# Patient Record
Sex: Female | Born: 1950 | Race: White | Hispanic: No | Marital: Married | Smoking: Never smoker
Health system: Southern US, Community
[De-identification: ages and names within clinical notes are randomized; demographics above are authoritative.]

## PROBLEM LIST (undated history)

## (undated) DIAGNOSIS — N189 Chronic kidney disease, unspecified: Secondary | ICD-10-CM

## (undated) DIAGNOSIS — M1990 Unspecified osteoarthritis, unspecified site: Secondary | ICD-10-CM

## (undated) DIAGNOSIS — Z9221 Personal history of antineoplastic chemotherapy: Secondary | ICD-10-CM

## (undated) DIAGNOSIS — K219 Gastro-esophageal reflux disease without esophagitis: Secondary | ICD-10-CM

## (undated) DIAGNOSIS — D696 Thrombocytopenia, unspecified: Secondary | ICD-10-CM

## (undated) DIAGNOSIS — C9201 Acute myeloblastic leukemia, in remission: Secondary | ICD-10-CM

## (undated) DIAGNOSIS — I10 Essential (primary) hypertension: Secondary | ICD-10-CM

## (undated) DIAGNOSIS — M8580 Other specified disorders of bone density and structure, unspecified site: Secondary | ICD-10-CM

## (undated) DIAGNOSIS — R1310 Dysphagia, unspecified: Secondary | ICD-10-CM

## (undated) HISTORY — DX: Other specified disorders of bone density and structure, unspecified site: M85.80

## (undated) HISTORY — DX: Essential (primary) hypertension: I10

## (undated) HISTORY — PX: AUGMENTATION MAMMAPLASTY: SUR837

---

## 2004-05-10 HISTORY — PX: PLACEMENT OF BREAST IMPLANTS: SHX6334

## 2015-05-11 HISTORY — PX: BREAST IMPLANT REMOVAL: SHX5361

## 2016-05-10 HISTORY — PX: PERCUTANEOUS FIXATION TIBIAL SHAFT FRACTURE W/ PINS / SCREWS: SUR1009

## 2018-07-09 DIAGNOSIS — C801 Malignant (primary) neoplasm, unspecified: Secondary | ICD-10-CM

## 2018-07-09 HISTORY — DX: Malignant (primary) neoplasm, unspecified: C80.1

## 2019-06-09 LAB — COLOGUARD: COLOGUARD: NEGATIVE

## 2019-10-16 DIAGNOSIS — C9201 Acute myeloblastic leukemia, in remission: Secondary | ICD-10-CM | POA: Insufficient documentation

## 2019-10-16 DIAGNOSIS — N189 Chronic kidney disease, unspecified: Secondary | ICD-10-CM | POA: Insufficient documentation

## 2019-10-16 DIAGNOSIS — Z8616 Personal history of COVID-19: Secondary | ICD-10-CM | POA: Insufficient documentation

## 2019-10-16 DIAGNOSIS — D61818 Other pancytopenia: Secondary | ICD-10-CM | POA: Insufficient documentation

## 2020-03-05 DIAGNOSIS — C9201 Acute myeloblastic leukemia, in remission: Secondary | ICD-10-CM | POA: Insufficient documentation

## 2020-03-05 DIAGNOSIS — E7800 Pure hypercholesterolemia, unspecified: Secondary | ICD-10-CM | POA: Insufficient documentation

## 2020-03-05 DIAGNOSIS — I10 Essential (primary) hypertension: Secondary | ICD-10-CM | POA: Insufficient documentation

## 2020-03-10 ENCOUNTER — Other Ambulatory Visit: Payer: Self-pay | Admitting: Internal Medicine

## 2020-03-10 DIAGNOSIS — IMO0002 Reserved for concepts with insufficient information to code with codable children: Secondary | ICD-10-CM

## 2020-03-10 DIAGNOSIS — R7989 Other specified abnormal findings of blood chemistry: Secondary | ICD-10-CM

## 2020-03-11 ENCOUNTER — Encounter (INDEPENDENT_AMBULATORY_CARE_PROVIDER_SITE_OTHER): Payer: Self-pay

## 2020-03-11 ENCOUNTER — Ambulatory Visit
Admission: RE | Admit: 2020-03-11 | Discharge: 2020-03-11 | Disposition: A | Discharge status: Home / Self Care | Payer: Medicare HMO | Source: Ambulatory Visit | Attending: Internal Medicine | Admitting: Internal Medicine

## 2020-03-11 ENCOUNTER — Other Ambulatory Visit: Payer: Self-pay

## 2020-03-11 DIAGNOSIS — R7989 Other specified abnormal findings of blood chemistry: Secondary | ICD-10-CM

## 2020-03-11 DIAGNOSIS — IMO0002 Reserved for concepts with insufficient information to code with codable children: Secondary | ICD-10-CM

## 2020-03-11 DIAGNOSIS — R944 Abnormal results of kidney function studies: Secondary | ICD-10-CM | POA: Insufficient documentation

## 2020-03-20 ENCOUNTER — Other Ambulatory Visit: Payer: Self-pay | Admitting: Internal Medicine

## 2020-03-20 DIAGNOSIS — Z1231 Encounter for screening mammogram for malignant neoplasm of breast: Secondary | ICD-10-CM

## 2020-03-21 ENCOUNTER — Other Ambulatory Visit: Payer: Self-pay

## 2020-03-21 ENCOUNTER — Inpatient Hospital Stay: Payer: Medicare HMO | Attending: Oncology | Admitting: Oncology

## 2020-03-21 ENCOUNTER — Telehealth: Payer: Self-pay

## 2020-03-21 ENCOUNTER — Inpatient Hospital Stay: Payer: Medicare HMO

## 2020-03-21 ENCOUNTER — Encounter: Payer: Self-pay | Admitting: Oncology

## 2020-03-21 VITALS — HR 84 | Temp 99.3°F | Resp 18

## 2020-03-21 DIAGNOSIS — D696 Thrombocytopenia, unspecified: Secondary | ICD-10-CM | POA: Insufficient documentation

## 2020-03-21 DIAGNOSIS — C9201 Acute myeloblastic leukemia, in remission: Secondary | ICD-10-CM | POA: Insufficient documentation

## 2020-03-21 DIAGNOSIS — Z8616 Personal history of COVID-19: Secondary | ICD-10-CM | POA: Diagnosis not present

## 2020-03-21 DIAGNOSIS — D72819 Decreased white blood cell count, unspecified: Secondary | ICD-10-CM | POA: Insufficient documentation

## 2020-03-21 DIAGNOSIS — D539 Nutritional anemia, unspecified: Secondary | ICD-10-CM

## 2020-03-21 DIAGNOSIS — Z808 Family history of malignant neoplasm of other organs or systems: Secondary | ICD-10-CM | POA: Insufficient documentation

## 2020-03-21 LAB — CBC WITH DIFFERENTIAL/PLATELET
Abs Immature Granulocytes: 0.02 10*3/uL (ref 0.00–0.07)
Basophils Absolute: 0 10*3/uL (ref 0.0–0.1)
Basophils Relative: 0 %
Eosinophils Absolute: 0.1 10*3/uL (ref 0.0–0.5)
Eosinophils Relative: 2 %
HCT: 32.8 % — ABNORMAL LOW (ref 36.0–46.0)
Hemoglobin: 11.3 g/dL — ABNORMAL LOW (ref 12.0–15.0)
Immature Granulocytes: 0 %
Lymphocytes Relative: 10 %
Lymphs Abs: 0.6 10*3/uL — ABNORMAL LOW (ref 0.7–4.0)
MCH: 33.8 pg (ref 26.0–34.0)
MCHC: 34.5 g/dL (ref 30.0–36.0)
MCV: 98.2 fL (ref 80.0–100.0)
Monocytes Absolute: 0.5 10*3/uL (ref 0.1–1.0)
Monocytes Relative: 10 %
Neutro Abs: 4.3 10*3/uL (ref 1.7–7.7)
Neutrophils Relative %: 78 %
Platelets: 94 10*3/uL — ABNORMAL LOW (ref 150–400)
RBC: 3.34 MIL/uL — ABNORMAL LOW (ref 3.87–5.11)
RDW: 12.1 % (ref 11.5–15.5)
WBC: 5.6 10*3/uL (ref 4.0–10.5)
nRBC: 0 % (ref 0.0–0.2)

## 2020-03-21 LAB — COMPREHENSIVE METABOLIC PANEL
ALT: 14 U/L (ref 0–44)
AST: 20 U/L (ref 15–41)
Albumin: 4.4 g/dL (ref 3.5–5.0)
Alkaline Phosphatase: 37 U/L — ABNORMAL LOW (ref 38–126)
Anion gap: 6 (ref 5–15)
BUN: 21 mg/dL (ref 8–23)
CO2: 28 mmol/L (ref 22–32)
Calcium: 9.5 mg/dL (ref 8.9–10.3)
Chloride: 104 mmol/L (ref 98–111)
Creatinine, Ser: 1.06 mg/dL — ABNORMAL HIGH (ref 0.44–1.00)
GFR, Estimated: 58 mL/min — ABNORMAL LOW (ref >60)
Glucose, Bld: 104 mg/dL — ABNORMAL HIGH (ref 70–99)
Potassium: 4.1 mmol/L (ref 3.5–5.1)
Sodium: 138 mmol/L (ref 135–145)
Total Bilirubin: 0.6 mg/dL (ref 0.3–1.2)
Total Protein: 7.3 g/dL (ref 6.5–8.1)

## 2020-03-21 LAB — FOLATE: Folate: 42 ng/mL (ref >5.9)

## 2020-03-21 LAB — LACTATE DEHYDROGENASE: LDH: 144 U/L (ref 98–192)

## 2020-03-21 LAB — VITAMIN B12: Vitamin B-12: 1239 pg/mL — ABNORMAL HIGH (ref 180–914)

## 2020-03-21 LAB — PATHOLOGIST SMEAR REVIEW

## 2020-03-21 NOTE — Progress Notes (Signed)
Patient here for initial oncology appointment, expresses no complaints or concerns at this time.   

## 2020-03-23 NOTE — Progress Notes (Addendum)
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   HISTORY OF PRESENTING ILLNESS:   Jenna Wiley is a  69 y.o.  female with PMH listed below was seen in consultation at the request of  Lauro Regulus, MD  for evaluation of AML.   I obtained patient's past medical history and extensive medical record review was performed.  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  Patient received leukemia chemotherapy at [facility]- Dr.Kathleen Dorritie 08/2018 -09/05/2018 7+3 regimen. Day 14 marrow showed blasts <5%,  09/08/2018 bone marrow biopsy reviewed hypocellular marrow with 5-7 percent blasts, consistent with marrow recovery.  Office notes indicate that MRD testing was negative on the specimen. 09/2018  [facility]AC 10/17/2018-10/24/2018 [facility]AC  11/02/2018 - 11/16/2018 Febrile neutropenia, strep viridans as well as aortic valve endocarditis with AV vegetation. She finished extended course of IV antibitotics.   12/12/2018 Bone marrow biopsy 10-20% cellularity, CR. Concesus decision was made to forego further consolidation treatments. With plan for series CBC monitoring.   She moved to and followed up with Dr.Millenson [place]im[place] and have CBC monitored periodically.   05/2019 COVID 19 infection, managed with supportive care at home and symptoms resolved within 2 weeks.   10/15/2019 seen by Dr.Millenson. She was recommended for COVID vaccination and deferred at that time.  Blood work showed Wbc 3.8, Hb 11.2, hct 32.9, MCV 101, platelet 78000, neutrophil 76%, lymphocyte 11.7% ANC 2.9  Patient has moved to [facility] since then.  Today she reports feeling well.  No fever,  chills, night sweats, unintentional weight loss.  Appetite is good.  She has establish care with primary care provider Dr. Dareen Piano and was seen on 03/05/2020.  Patient was referred to establish care with hematology for surveillance.  Review of Systems  Constitutional: Negative for appetite change, chills, fatigue and fever.  HENT:   Negative for hearing loss and voice change.   Eyes: Negative for eye problems.  Respiratory: Negative for chest tightness and cough.   Cardiovascular: Negative for chest pain.  Gastrointestinal: Negative for abdominal distention, abdominal pain and blood in stool.  Endocrine: Negative for hot flashes.  Genitourinary: Negative for difficulty urinating and frequency.   Musculoskeletal: Negative for arthralgias.  Skin: Negative for itching and rash.  Neurological: Negative for extremity weakness.  Hematological: Negative for adenopathy.  Psychiatric/Behavioral: Negative for confusion.    MEDICAL HISTORY:  Past Medical History:  Diagnosis Date  . 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 Hypertension   . Osteopenia     SURGICAL HISTORY: Past Surgical History:  Procedure Laterality Date  . BREAST IMPLANT REMOVAL  2017  . CESAREAN SECTION  1983  . PERCUTANEOUS FIXATION TIBIAL SHAFT FRACTURE W/ PINS / SCREWS  2018   pin in thumb  . PLACEMENT OF BREAST IMPLANTS  2006    SOCIAL HISTORY: Social History   Socioeconomic History  . Marital status: Married    Spouse name: Not on file  . Number of children: Not on file  . Years of education: Not on file  . Highest education level: Not on file  Occupational History  . Not on file  Tobacco Use  . Smoking status: Never Smoker  Vaping Use  . Vaping Use: Never used  Substance and Sexual Activity  . Alcohol use: Yes    Comment: social drinker  . Drug use: Not Currently  . Sexual activity: Yes  Other Topics Concern  . Not on file  Social History Narrative  . Not on file   Social  Determinants of Health   Financial Resource Strain:   . Difficulty of Paying Living Expenses: Not on file  Food Insecurity:   . Worried About Charity fundraiser in the Last Year: Not on file  . Ran Out of Food in the Last Year: Not on file  Transportation Needs:   . Lack of Transportation (Medical): Not on file  . Lack of Transportation (Non-Medical): Not on file  Physical Activity:   . Days of Exercise per Week: Not on file  . Minutes of Exercise per Session: Not on file  Stress:   . Feeling of Stress : Not on file  Social Connections:   . Frequency of Communication with Friends and Family: Not on file  . Frequency of Social Gatherings with Friends and Family: Not on file  . Attends Religious Services: Not on file  . Active Member of Clubs or Organizations: Not on file  . Attends Archivist Meetings: Not on file  . Marital Status: Not on file  Intimate Partner Violence:   . Fear of Current or Ex-Partner: Not on file  . Emotionally Abused: Not on file  . Physically Abused: Not on file  . Sexually Abused: Not on file    FAMILY HISTORY: Family History  Problem Relation Age of Onset  . Brain cancer Mother   . Liver cancer Father   . Hypertension Father     ALLERGIES:  is allergic to nitrofurantoin.  MEDICATIONS:  Current Outpatient Medications  Medication Sig Dispense Refill  . amLODipine (NORVASC) 5 MG tablet Take 1 tablet by mouth daily.    Marland Kitchen atorvastatin (LIPITOR) 10 MG tablet Take by mouth.    . calcium citrate-vitamin D (CITRACAL+D) 315-200 MG-UNIT tablet Take by mouth.    . Cholecalciferol 50 MCG (2000 UT) TABS Take by mouth.    . cyanocobalamin 1000 MCG tablet Take by mouth.    . diphenhydrAMINE (BENADRYL) 25 mg capsule Take by mouth.    Marland Kitchen MAGNESIUM PO Take 1 tablet by mouth daily.    . Melatonin 10 MG TABS Take by mouth.    . Multiple Vitamin (MULTIVITAMIN ADULT PO) Take by mouth.    . Multiple Vitamins-Minerals (DESICCATED LIVER/B-12 PO) Take by  mouth.    . Omega-3 Fatty Acids (FISH OIL PO) Take 1 capsule by mouth daily.    . TURMERIC PO Take by mouth.    Marland Kitchen UNABLE TO FIND Med Name: Collegan 1 tsp    . vitamin B-12 (CYANOCOBALAMIN) 1000 MCG tablet Take 1,000 mcg by mouth daily.     No current facility-administered medications for this visit.     PHYSICAL EXAMINATION: ECOG PERFORMANCE STATUS: 1 - Symptomatic but completely ambulatory Vitals:   03/21/20 1117  Pulse: 84  Resp: 18  Temp: 99.3 F (37.4 C)  SpO2: 99%   There were no vitals filed for this visit.  Physical Exam Constitutional:      General: She is not in acute distress. HENT:     Head: Normocephalic and atraumatic.  Eyes:     General: No scleral icterus. Cardiovascular:     Rate and Rhythm: Normal rate and regular rhythm.     Heart sounds: Normal heart sounds.  Pulmonary:  Effort: Pulmonary effort is normal. No respiratory distress.     Breath sounds: No wheezing.  Abdominal:     General: Bowel sounds are normal. There is no distension.     Palpations: Abdomen is soft.  Musculoskeletal:        General: No deformity. Normal range of motion.     Cervical back: Normal range of motion and neck supple.  Skin:    General: Skin is warm and dry.     Findings: No erythema or rash.  Neurological:     Mental Status: She is alert and oriented to person, place, and time. Mental status is at baseline.     Cranial Nerves: No cranial nerve deficit.     Coordination: Coordination normal.  Psychiatric:        Mood and Affect: Mood normal.     LABORATORY DATA:  I have reviewed the data as listed Lab Results  Component Value Date   WBC 5.6 03/21/2020   HGB 11.3 (L) 03/21/2020   HCT 32.8 (L) 03/21/2020   MCV 98.2 03/21/2020   PLT 94 (L) 03/21/2020   Recent Labs    03/21/20 1215  NA 138  K 4.1  CL 104  CO2 28  GLUCOSE 104*  BUN 21  CREATININE 1.06*  CALCIUM 9.5  GFRNONAA 58*  PROT 7.3  ALBUMIN 4.4  AST 20  ALT 14  ALKPHOS 37*  BILITOT 0.6    Iron/TIBC/Ferritin/ %Sat No results found for: IRON, TIBC, FERRITIN, IRONPCTSAT    RADIOGRAPHIC STUDIES: I have personally reviewed the radiological images as listed and agreed with the findings in the report. US RENAL  Result Date: 03/11/2020 CLINICAL DATA:  Initial evaluation for increased creatinine. EXAM: RENAL / URINARY TRACT ULTRASOUND COMPLETE COMPARISON:  None available. FINDINGS: Right Kidney: Renal measurements: 8.9 x 3.9 x 4.2 cm = volume: 77 mL. Renal echogenicity within normal limits. No nephrolithiasis or hydronephrosis. Extrarenal pelvis noted. No focal renal mass. Left Kidney: Renal measurements: 10.5 x 4.6 x 5.5 cm = volume: 139 mL. Renal echogenicity within normal limits. No nephrolithiasis or hydronephrosis. 0.9 x 0.9 x 1.1 cm benign appearing parapelvic cyst noted at the lower pole. Additional 1.2 x 1.2 x 1.6 cm benign-appearing parapelvic cyst present at the upper pole. Bladder: Appears normal for degree of bladder distention. Bilateral jets are visualized. Other: None. IMPRESSION: 1. No hydronephrosis or other acute abnormality. Renal echogenicity within normal limits. 2. Small benign-appearing left renal parapelvic cysts as above. Electronically Signed   By: Jeannine Boga M.D.   On: 03/11/2020 21:39      ASSESSMENT & PLAN:  1. Leukopenia, unspecified type   2. Macrocytic anemia   3. AML (acute myeloid leukemia) in remission ([place])    #History of AML, I have received medical records from [facility].  Pending records from [facility] where she received for her chemotherapy and had her bone marrow biopsy done. Clinically she appears doing very well. Check peripheral blood flow cytometry Recommend CBC Q2-3 months for 2 years, then every 6 months for up to 5 years.  Bone marrow biopsy as clinically indicated.   #Macrocytic anemia, this seems to be chronic since her AML treatment.  Comparing to June 2021 labs, Hemoglobin level is  stable.  MCV has normalized. Check vitamin B12 level and folate level.  #Leukopenia, Predominantly lymphocytopenia.  Total white count is normal today. #Thrombocytopenia, platelet count is 94,000 today.  Improved comparing to June 2021.  I called patient and communicated with  her about above results and surveillance plan.  She agrees.  Orders Placed This Encounter  Procedures  . CBC with Differential/Platelet    Standing Status:   Future    Number of Occurrences:   1    Standing Expiration Date:   03/21/2021  . Comprehensive metabolic panel    Standing Status:   Future    Number of Occurrences:   1    Standing Expiration Date:   03/21/2021  . Lactate dehydrogenase    Standing Status:   Future    Number of Occurrences:   1    Standing Expiration Date:   03/21/2021  . Vitamin B12    Standing Status:   Future    Number of Occurrences:   1    Standing Expiration Date:   03/21/2021  . Folate    Standing Status:   Future    Number of Occurrences:   1    Standing Expiration Date:   03/21/2021  . Flow cytometry panel-leukemia/lymphoma work-up    Standing Status:   Future    Number of Occurrences:   1    Standing Expiration Date:   03/21/2021  . Pathologist smear review    Standing Status:   Future    Number of Occurrences:   1    Standing Expiration Date:   03/21/2021    All questions were answered. The patient knows to call the clinic with any problems questions or concerns.  cc Kirk Ruths, MD    Return of visit: 3 months.  Thank you for this kind referral and the opportunity to participate in the care of this patient. A copy of today's note is routed to referring provider    Earlie Server, MD, PhD Hematology Oncology [facility] at [facility] Pager- [phone] 03/23/2020

## 2020-03-25 LAB — COMP PANEL: LEUKEMIA/LYMPHOMA

## 2020-03-28 ENCOUNTER — Encounter: Payer: Self-pay | Admitting: Oncology

## 2020-03-28 NOTE — Progress Notes (Signed)
Created documentation encounter to pull over medical records from [facility].

## 2020-04-01 ENCOUNTER — Telehealth: Payer: Self-pay

## 2020-04-01 NOTE — Telephone Encounter (Signed)
Pt notified. Will wait for a call with plan update.

## 2020-04-01 NOTE — Telephone Encounter (Signed)
-----   Message from Earlie Server, MD sent at 03/28/2020  4:10 PM EST ----- I called her and not able to reach her. Please let her know that her her peripheral blood leukemia testing result is good. I am still waiting for her records to come and then decide what will be the next best step.

## 2020-04-10 ENCOUNTER — Encounter: Payer: Self-pay | Admitting: Oncology

## 2020-04-11 NOTE — Telephone Encounter (Signed)
Per Result note from Dr. Tasia Catchings: Please arrange patient to do lab in mid February. And see me 1 week after the labs. She knows the plan. Lab has been ordered thank you

## 2020-04-11 NOTE — Telephone Encounter (Signed)
Done.. Appt were sched as requested A detailed message was left on pts VM making her aware of the sched appts details

## 2020-04-12 ENCOUNTER — Encounter: Payer: Self-pay | Admitting: Oncology

## 2020-04-12 DIAGNOSIS — Z7189 Other specified counseling: Secondary | ICD-10-CM | POA: Insufficient documentation

## 2020-05-15 ENCOUNTER — Other Ambulatory Visit: Payer: Self-pay

## 2020-05-15 ENCOUNTER — Other Ambulatory Visit: Payer: Self-pay | Admitting: Internal Medicine

## 2020-05-15 ENCOUNTER — Ambulatory Visit
Admission: RE | Admit: 2020-05-15 | Discharge: 2020-05-15 | Disposition: A | Discharge status: Home / Self Care | Payer: Medicare HMO | Source: Ambulatory Visit | Attending: Internal Medicine | Admitting: Internal Medicine

## 2020-05-15 DIAGNOSIS — Z1231 Encounter for screening mammogram for malignant neoplasm of breast: Secondary | ICD-10-CM | POA: Diagnosis not present

## 2020-05-30 ENCOUNTER — Encounter: Payer: Self-pay | Admitting: Emergency Medicine

## 2020-05-30 ENCOUNTER — Emergency Department: Payer: Medicare HMO

## 2020-05-30 ENCOUNTER — Emergency Department
Admission: EM | Admit: 2020-05-30 | Discharge: 2020-05-30 | Disposition: A | Discharge status: Home / Self Care | Payer: Medicare HMO | Attending: Emergency Medicine | Admitting: Emergency Medicine

## 2020-05-30 ENCOUNTER — Other Ambulatory Visit: Payer: Self-pay

## 2020-05-30 DIAGNOSIS — S42111A Displaced fracture of body of scapula, right shoulder, initial encounter for closed fracture: Secondary | ICD-10-CM | POA: Insufficient documentation

## 2020-05-30 DIAGNOSIS — S43014A Anterior dislocation of right humerus, initial encounter: Secondary | ICD-10-CM | POA: Insufficient documentation

## 2020-05-30 DIAGNOSIS — S4991XA Unspecified injury of right shoulder and upper arm, initial encounter: Secondary | ICD-10-CM | POA: Diagnosis present

## 2020-05-30 DIAGNOSIS — S4291XA Fracture of right shoulder girdle, part unspecified, initial encounter for closed fracture: Secondary | ICD-10-CM

## 2020-05-30 DIAGNOSIS — M25511 Pain in right shoulder: Secondary | ICD-10-CM

## 2020-05-30 DIAGNOSIS — Z856 Personal history of leukemia: Secondary | ICD-10-CM | POA: Insufficient documentation

## 2020-05-30 DIAGNOSIS — I10 Essential (primary) hypertension: Secondary | ICD-10-CM | POA: Insufficient documentation

## 2020-05-30 DIAGNOSIS — Z79899 Other long term (current) drug therapy: Secondary | ICD-10-CM | POA: Insufficient documentation

## 2020-05-30 DIAGNOSIS — W009XXA Unspecified fall due to ice and snow, initial encounter: Secondary | ICD-10-CM | POA: Diagnosis not present

## 2020-05-30 MED ORDER — OXYCODONE-ACETAMINOPHEN 5-325 MG PO TABS
1.0000 | ORAL_TABLET | Freq: Once | ORAL | Status: AC
Start: 2020-05-30 — End: 2020-05-30
  Administered 2020-05-30: 1 via ORAL
  Filled 2020-05-30: qty 1

## 2020-05-30 MED ORDER — ONDANSETRON 4 MG PO TBDP
4.0000 mg | ORAL_TABLET | Freq: Once | ORAL | Status: AC
  Administered 2020-05-30: 4 mg via ORAL
  Filled 2020-05-30: qty 1

## 2020-05-30 MED ORDER — LIDOCAINE-EPINEPHRINE 2 %-1:100000 IJ SOLN
20.0000 mL | Freq: Once | INTRAMUSCULAR | Status: AC
  Administered 2020-05-30: 20 mL
  Filled 2020-05-30: qty 1

## 2020-05-30 NOTE — ED Provider Notes (Signed)
[facility] Emergency Department Provider Note ____________________________________________   Event Date/Time   First MD Initiated Contact with Patient 05/30/20 1940     (approximate)  I have reviewed the triage vital signs and the nursing notes.  HISTORY  Chief Complaint Fall and Arm Pain   HPI Jenna Wiley is a 70 y.o. femalewho presents to the ED for evaluation of arm pain after a fall.   Chart review indicates hx AML, HTN Currently in remission for AML.  Patient ports slipping on ice onto outstretched right hand, causing immediate shoulder pain and injury. Patient reports this occurred about 3:30 PM. Patient reports severe pain, improving with triage Percocet. Husband at the bedside provides additional history. Patient reports currently 8/10 intensity right shoulder pain. Aching and nonradiating.   Past Medical History:  Diagnosis Date   Cancer ([place]) 07/09/2018   AML leukemia   Hypertension    Osteopenia     Patient Active Problem List   Diagnosis Date Noted   Goals of care, counseling/discussion 04/12/2020   Macrocytic anemia 03/21/2020   Leukopenia 03/21/2020   Acute myeloid leukemia in remission ([place]) 03/05/2020   Essential hypertension 03/05/2020   Hypercholesterolemia 03/05/2020    Past Surgical History:  Procedure Laterality Date   AUGMENTATION MAMMAPLASTY Bilateral    2006; replaced 2017   BREAST IMPLANT REMOVAL  2017   CESAREAN SECTION  1983   PERCUTANEOUS FIXATION TIBIAL SHAFT FRACTURE W/ PINS / SCREWS  2018   pin in thumb   PLACEMENT OF BREAST IMPLANTS  2006    Prior to Admission medications   Medication Sig Start Date End Date Taking? Authorizing Provider  amLODipine (NORVASC) 5 MG tablet Take 1 tablet by mouth daily. 01/24/20   [provider]  atorvastatin (LIPITOR) 10 MG tablet Take by mouth.    [provider]  calcium citrate-vitamin D (CITRACAL+D) 315-200 MG-UNIT tablet Take by  mouth.    [provider]  Cholecalciferol 50 MCG (2000 UT) TABS Take by mouth.    [provider]  cyanocobalamin 1000 MCG tablet Take by mouth.    [provider]  diphenhydrAMINE (BENADRYL) 25 mg capsule Take by mouth.    [provider]  MAGNESIUM PO Take 1 tablet by mouth daily.    [provider]  Melatonin 10 MG TABS Take by mouth.    [provider]  Multiple Vitamin (MULTIVITAMIN ADULT PO) Take by mouth.    [provider]  Multiple Vitamins-Minerals (DESICCATED LIVER/B-12 PO) Take by mouth.    [provider]  Omega-3 Fatty Acids (FISH OIL PO) Take 1 capsule by mouth daily.    [provider]  TURMERIC PO Take by mouth.    [provider]  UNABLE TO FIND Med Name: Collegan 1 tsp    [provider]  vitamin B-12 (CYANOCOBALAMIN) 1000 MCG tablet Take 1,000 mcg by mouth daily.    [provider]    Allergies Nitrofurantoin  Family History  Problem Relation Age of Onset   Brain cancer Mother    Liver cancer Father    Hypertension Father    Breast cancer Neg Hx     Social History Social History   Tobacco Use   Smoking status: Never Smoker   Smokeless tobacco: Never Used  Scientific laboratory technician Use: Never used  Substance Use Topics   Alcohol use: Yes    Comment: social drinker   Drug use: Not Currently    Review of Systems  Constitutional: No fever/chills Eyes: No visual changes. ENT: No sore throat. Cardiovascular: Denies chest pain. Respiratory: Denies shortness of breath. Gastrointestinal: No abdominal pain.  No nausea, no vomiting.  No diarrhea.  No constipation. Genitourinary: Negative for dysuria. Musculoskeletal: Negative for back pain. Positive right shoulder pain. Skin: Negative for rash. Neurological: Negative for headaches, focal weakness or numbness.  ____________________________________________   PHYSICAL EXAM:  VITAL  SIGNS: Vitals:   05/30/20 1957 05/30/20 2030  BP: (!) 151/89 (!) 147/72  Pulse: 88 88  Resp: 16 16  Temp:    SpO2: 100% 100%     Constitutional: Alert and oriented. Well appearing and in no acute distress. Eyes: Conjunctivae are normal. PERRL. EOMI. Head: Atraumatic. Nose: No congestion/rhinnorhea. Mouth/Throat: Mucous membranes are moist.  Oropharynx non-erythematous. Neck: No stridor. No cervical spine tenderness to palpation. Cardiovascular: Normal rate, regular rhythm. Grossly normal heart sounds.  Good peripheral circulation. Respiratory: Normal respiratory effort.  No retractions. Lungs CTAB. Gastrointestinal: Soft , nondistended, nontender to palpation. No CVA tenderness. Musculoskeletal: No lower extremity tenderness nor edema.  No joint effusions.  Signs of an anterior shoulder dislocation, empty glenoid fossa. No evidence of distal neurovascular deficits. Full palpation of all 4 extremities otherwise not evidence of deformity, tenderness or trauma. Neurologic:  Normal speech and language. No gross focal neurologic deficits are appreciated. No gait instability noted. Skin:  Skin is warm, dry and intact. No rash noted. Psychiatric: Mood and affect are normal. Speech and behavior are normal.  ____________________________________________   LABS (all labs ordered are listed, but only abnormal results are displayed)  Labs Reviewed - No data to display  ____________________________________________  RADIOLOGY  ED MD interpretation: Plain film of the right shoulder reviewed by me with evidence of anterior inferior shoulder dislocation.  No evidence of associated fracture.  Repeat plain films of the right shoulder reviewed by me with evidence of successful reduction  Official radiology report(s): DG Shoulder Right  Result Date: 05/30/2020 CLINICAL DATA:  Pain, fall EXAM: RIGHT SHOULDER - 2+ VIEW COMPARISON:  None. FINDINGS: Anterior, inferior dislocation of right humeral  head with respect to the glenoid fossa. No fracture identified. IMPRESSION: Anterior, inferior right shoulder dislocation. Electronically Signed   By: Jasmine Pang M.D.   On: 05/30/2020 17:37   DG Shoulder Right Portable  Result Date: 05/30/2020 CLINICAL DATA:  Postreduction EXAM: PORTABLE RIGHT SHOULDER COMPARISON:  05/30/2020 FINDINGS: Interval relocation of the right shoulder with anatomic appearance of the glenohumeral joint on the views obtained. No acute fractures are identified. Soft tissues are unremarkable. IMPRESSION: Anatomic appearance of the right shoulder postreduction. Electronically Signed   By: Burman Nieves M.D.   On: 05/30/2020 20:30    ____________________________________________   PROCEDURES and INTERVENTIONS  Procedure(s) performed (including Critical Care):  Injection of joint  Date/Time: 05/30/2020 11:02 PM Performed by: Delton Prairie, MD Authorized by: Delton Prairie, MD  Consent: Verbal consent obtained. Risks and benefits: risks, benefits and alternatives were discussed Consent given by: patient and spouse Preparation: Patient was prepped and draped in the usual sterile fashion. Patient tolerance: patient tolerated the procedure well with no immediate complications Comments: Identified via landmarks, and skin cleaned with alcohol swab, 10 mL of lidocaine with epinephrine injected to her right glenohumeral joint space  .1-3 Lead EKG Interpretation Performed by: Delton Prairie, MD Authorized by: Delton Prairie, MD     Interpretation: normal     ECG rate:  80   ECG rate assessment: normal     Rhythm: sinus rhythm  Ectopy: none     Conduction: normal   Reduction of dislocation  Date/Time: 05/30/2020 11:03 PM Performed by: Vladimir Crofts, MD Authorized by: Vladimir Crofts, MD  Risks and benefits: risks, benefits and alternatives were discussed Consent given by: patient and spouse Patient tolerance: patient tolerated the procedure well with no immediate  complications Comments: Utilizing traction and countertraction, with external rotation and abduction of her right arm, easy reduction of her right shoulder anterior dislocation.  Clinically reduced, and confirmed with x-ray.     Medications  oxyCODONE-acetaminophen (PERCOCET/ROXICET) 5-325 MG per tablet 1 tablet (1 tablet Oral Given 05/30/20 1856)  ondansetron (ZOFRAN-ODT) disintegrating tablet 4 mg (4 mg Oral Given 05/30/20 1856)  lidocaine-EPINEPHrine (XYLOCAINE W/EPI) 2 %-1:100000 (with pres) injection 20 mL (20 mLs Infiltration Given by Other 05/30/20 1954)    ____________________________________________   MDM / ED COURSE   Pleasant 70 year old woman presents to the ED after mechanical fall on ice with an anterior shoulder dislocation amenable to bedside reduction and outpatient management.  Normal vitals on room air.  Exam with evidence of a closed right-sided shoulder dislocation without associated neurovascular deficits.  No further evidence of acute trauma beyond the pain and dislocation to her right shoulder.  Plain films confirm anterior dislocation.  Patient provided Percocet p.o. and intra-articular lidocaine with adequate pain control to facilitate bedside reduction, as listed above.  Provided sling, recommendations to follow-up with orthopedics and return precautions for the ED.   Clinical Course as of 05/30/20 2302  Fri May 30, 2020  2002 Intra-articular lidocaine applied. [DS]  7654 Uncomplicated bedside reduction performed.  Well-tolerated.  Clinically reduced.  Awaiting x-ray imaging.  Discussed outpatient management and return precautions for the ED with patient and husband, now at the bedside. [DS]    Clinical Course User Index [DS] Vladimir Crofts, MD    ____________________________________________   FINAL CLINICAL IMPRESSION(S) / ED DIAGNOSES  Final diagnoses:  Traumatic closed displaced fracture of right shoulder with anterior dislocation, initial encounter  Acute  pain of right shoulder     ED Discharge Orders    None       Jaquaveon Bilal   Note:  This document was prepared using Dragon voice recognition software and may include unintentional dictation errors.   Vladimir Crofts, MD 05/30/20 [phone]

## 2020-05-30 NOTE — ED Triage Notes (Signed)
Pt to ED via POV stating that she slipped and fell on the ice. Pt states that she is having pain in her right arm. Pt states that she did not hit her head. Pt states that she is unable to move her right arm. Possible deformity felt in the right shoulder.

## 2020-05-30 NOTE — ED Notes (Signed)
Right shoulder reduction completed by Dr. Tamala Julian, patient placed in sling. Patient tolerated well.

## 2020-05-30 NOTE — Discharge Instructions (Addendum)
Please take Tylenol and ibuprofen/Advil for your pain.  It is safe to take them together, or to alternate them every few hours.  Take up to 1000mg  of Tylenol at a time, up to 4 times per day.  Do not take more than 4000 mg of Tylenol in 24 hours.  For ibuprofen, take 400-600 mg, 4-5 times per day.  As we discussed, keep your arm in the sling to prevent your shoulder from coming back out of place.  Follow-up with orthopedic surgeon, have attached the phone number for Dr. Posey Pronto.  If you develop further dislocations, please return to the ED

## 2020-06-05 ENCOUNTER — Other Ambulatory Visit: Payer: Self-pay | Admitting: Student

## 2020-06-05 DIAGNOSIS — S43004A Unspecified dislocation of right shoulder joint, initial encounter: Secondary | ICD-10-CM

## 2020-06-06 ENCOUNTER — Ambulatory Visit
Admission: RE | Admit: 2020-06-06 | Discharge: 2020-06-06 | Disposition: A | Discharge status: Home / Self Care | Payer: Medicare HMO | Source: Ambulatory Visit | Attending: Student | Admitting: Student

## 2020-06-06 ENCOUNTER — Other Ambulatory Visit: Payer: Self-pay

## 2020-06-06 ENCOUNTER — Other Ambulatory Visit: Payer: Self-pay | Admitting: Student

## 2020-06-06 DIAGNOSIS — S43014A Anterior dislocation of right humerus, initial encounter: Secondary | ICD-10-CM | POA: Insufficient documentation

## 2020-06-06 DIAGNOSIS — S43004A Unspecified dislocation of right shoulder joint, initial encounter: Secondary | ICD-10-CM

## 2020-06-26 ENCOUNTER — Other Ambulatory Visit: Payer: Medicare HMO

## 2020-07-02 ENCOUNTER — Inpatient Hospital Stay: Payer: Medicare HMO | Attending: Oncology

## 2020-07-02 DIAGNOSIS — D72819 Decreased white blood cell count, unspecified: Secondary | ICD-10-CM | POA: Insufficient documentation

## 2020-07-02 DIAGNOSIS — D539 Nutritional anemia, unspecified: Secondary | ICD-10-CM

## 2020-07-02 DIAGNOSIS — C9201 Acute myeloblastic leukemia, in remission: Secondary | ICD-10-CM

## 2020-07-02 LAB — COMPREHENSIVE METABOLIC PANEL
ALT: 13 U/L (ref 0–44)
AST: 18 U/L (ref 15–41)
Albumin: 4.5 g/dL (ref 3.5–5.0)
Alkaline Phosphatase: 38 U/L (ref 38–126)
Anion gap: 10 (ref 5–15)
BUN: 24 mg/dL — ABNORMAL HIGH (ref 8–23)
CO2: 23 mmol/L (ref 22–32)
Calcium: 9 mg/dL (ref 8.9–10.3)
Chloride: 106 mmol/L (ref 98–111)
Creatinine, Ser: 1.02 mg/dL — ABNORMAL HIGH (ref 0.44–1.00)
GFR, Estimated: >60 mL/min (ref >60)
Glucose, Bld: 105 mg/dL — ABNORMAL HIGH (ref 70–99)
Potassium: 4.1 mmol/L (ref 3.5–5.1)
Sodium: 139 mmol/L (ref 135–145)
Total Bilirubin: 0.5 mg/dL (ref 0.3–1.2)
Total Protein: 7.3 g/dL (ref 6.5–8.1)

## 2020-07-02 LAB — LACTATE DEHYDROGENASE: LDH: 135 U/L (ref 98–192)

## 2020-07-02 LAB — CBC WITH DIFFERENTIAL/PLATELET
Abs Immature Granulocytes: 0.01 10*3/uL (ref 0.00–0.07)
Basophils Absolute: 0 10*3/uL (ref 0.0–0.1)
Basophils Relative: 0 %
Eosinophils Absolute: 0.1 10*3/uL (ref 0.0–0.5)
Eosinophils Relative: 2 %
HCT: 34.1 % — ABNORMAL LOW (ref 36.0–46.0)
Hemoglobin: 12.1 g/dL (ref 12.0–15.0)
Immature Granulocytes: 0 %
Lymphocytes Relative: 22 %
Lymphs Abs: 0.8 10*3/uL (ref 0.7–4.0)
MCH: 34.1 pg — ABNORMAL HIGH (ref 26.0–34.0)
MCHC: 35.5 g/dL (ref 30.0–36.0)
MCV: 96.1 fL (ref 80.0–100.0)
Monocytes Absolute: 0.4 10*3/uL (ref 0.1–1.0)
Monocytes Relative: 11 %
Neutro Abs: 2.3 10*3/uL (ref 1.7–7.7)
Neutrophils Relative %: 65 %
Platelets: 124 10*3/uL — ABNORMAL LOW (ref 150–400)
RBC: 3.55 MIL/uL — ABNORMAL LOW (ref 3.87–5.11)
RDW: 11.9 % (ref 11.5–15.5)
WBC: 3.5 10*3/uL — ABNORMAL LOW (ref 4.0–10.5)
nRBC: 0 % (ref 0.0–0.2)

## 2020-07-02 LAB — IMMATURE PLATELET FRACTION: Immature Platelet Fraction: 3.3 % (ref 1.2–8.6)

## 2020-07-03 ENCOUNTER — Ambulatory Visit: Payer: Medicare HMO | Admitting: Oncology

## 2020-07-04 LAB — COMP PANEL: LEUKEMIA/LYMPHOMA

## 2020-07-09 ENCOUNTER — Inpatient Hospital Stay: Payer: Medicare HMO | Attending: Oncology | Admitting: Oncology

## 2020-07-09 ENCOUNTER — Encounter: Payer: Self-pay | Admitting: Oncology

## 2020-07-09 ENCOUNTER — Other Ambulatory Visit: Payer: Self-pay

## 2020-07-09 VITALS — BP 139/84 | HR 76 | Temp 98.7°F | Resp 18 | Wt 170.4 lb

## 2020-07-09 DIAGNOSIS — D696 Thrombocytopenia, unspecified: Secondary | ICD-10-CM | POA: Insufficient documentation

## 2020-07-09 DIAGNOSIS — C9201 Acute myeloblastic leukemia, in remission: Secondary | ICD-10-CM | POA: Insufficient documentation

## 2020-07-09 DIAGNOSIS — D72819 Decreased white blood cell count, unspecified: Secondary | ICD-10-CM

## 2020-07-09 NOTE — Progress Notes (Signed)
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   HISTORY OF PRESENTING ILLNESS:   Jenna Wiley is a  70 y.o.  female with PMH listed below was seen in consultation at the request of  Kirk Ruths, MD  for evaluation of AML.   I obtained patient's past medical history and extensive medical record review was performed.  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  Patient received leukemia chemotherapy at [facility] 08/2018 -09/05/2018 7+3 regimen. Day 14 marrow showed blasts <5%,  09/08/2018 bone marrow biopsy reviewed hypocellular marrow with 5-7% blasts, consistent with marrow recovery.  Office notes indicate that MRD testing was negative on the specimen.  NGS was also negative for WT1. 09/2018-10/24/2018 HIDAC x 2 cycles 11/02/2018 - 11/16/2018 Febrile neutropenia, strep viridans as well as aortic valve endocarditis with AV vegetation. She finished extended course of IV antibitotics.   12/12/2018 Bone marrow biopsy 10-20% cellularity, CR.  MRD by flow cytometry was negative. Concesus decision was made to forego further consolidation treatments. With plan for series CBC monitoring.   She moved to and followed up with Dr.Millenson [place] and have CBC monitored periodically.   05/2019 COVID 19 infection, managed with supportive care at home and symptoms resolved within 2 weeks.   10/15/2019 seen by Dr.Millenson. She was recommended for COVID vaccination and deferred at that time.  Blood work showed Wbc 3.8, Hb 11.2, hct 32.9, MCV 101, platelet 78000, neutrophil 76%, lymphocyte 11.7% ANC 2.9  Patient has moved to [facility] since then.  Today she reports feeling well.  No fever, chills, night sweats, unintentional weight loss.  Appetite is good.  She has establish care with primary care provider Dr. Ouida Sills and was seen on 03/05/2020.  Patient was referred to establish care with hematology for surveillance.   INTERVAL HISTORY Jenna Wiley is a 70 y.o. female who has above history reviewed by me today presents for follow up visit for management of history of AML, chronic thrombocytopenia Problems and complaints are listed below: Patient reports recently dislocated her right shoulder after fall.  Patient was seen by orthopedic surgeon. Otherwise she has no new complaints.  Denies any fever, night sweats, intentional weight loss..  Review of Systems  Constitutional: Negative for appetite change, chills, fatigue and fever.  HENT:   Negative for hearing loss and voice change.   Eyes: Negative for eye problems.  Respiratory: Negative for chest tightness and cough.   Cardiovascular: Negative for chest pain.  Gastrointestinal: Negative for abdominal distention, abdominal pain and blood in stool.  Endocrine: Negative for hot flashes.  Genitourinary: Negative for difficulty urinating and frequency.   Musculoskeletal: Negative for arthralgias.  Skin: Negative for itching and rash.  Neurological: Negative for extremity weakness.  Hematological: Negative for adenopathy.  Psychiatric/Behavioral: Negative for confusion.    MEDICAL HISTORY:  Past Medical History:  Diagnosis Date  . Cancer ([place]) 07/09/2018   AML leukemia  . Hypertension   . Osteopenia     SURGICAL HISTORY: Past Surgical History:  Procedure Laterality Date  . AUGMENTATION MAMMAPLASTY Bilateral    2006; replaced 2017  . BREAST IMPLANT REMOVAL  2017  . CESAREAN SECTION  1983  . PERCUTANEOUS FIXATION TIBIAL SHAFT  FRACTURE W/ PINS / SCREWS  2018   pin in thumb  . PLACEMENT OF BREAST IMPLANTS  2006    SOCIAL HISTORY: Social  History   Socioeconomic History  . Marital status: Married    Spouse name: Not on file  . Number of children: Not on file  . Years of education: Not on file  . Highest education level: Not on file  Occupational History  . Not on file  Tobacco Use  . Smoking status: Never Smoker  . Smokeless tobacco: Never Used  Vaping Use  . Vaping Use: Never used  Substance and Sexual Activity  . Alcohol use: Yes    Comment: social drinker  . Drug use: Not Currently  . Sexual activity: Yes  Other Topics Concern  . Not on file  Social History Narrative  . Not on file   Social Determinants of Health   Financial Resource Strain: Not on file  Food Insecurity: Not on file  Transportation Needs: Not on file  Physical Activity: Not on file  Stress: Not on file  Social Connections: Not on file  Intimate Partner Violence: Not on file    FAMILY HISTORY: Family History  Problem Relation Age of Onset  . Brain cancer Mother   . Liver cancer Father   . Hypertension Father   . Breast cancer Neg Hx     ALLERGIES:  is allergic to nitrofurantoin.  MEDICATIONS:  Current Outpatient Medications  Medication Sig Dispense Refill  . amLODipine (NORVASC) 5 MG tablet Take 1 tablet by mouth daily.    Marland Kitchen atorvastatin (LIPITOR) 10 MG tablet Take 10 mg by mouth daily.    . calcium citrate-vitamin D (CITRACAL+D) 315-200 MG-UNIT tablet Take by mouth.    . Cholecalciferol 50 MCG (2000 UT) TABS Take by mouth.    . diphenhydrAMINE (BENADRYL) 25 mg capsule Take 25 mg by mouth every 6 (six) hours as needed.    Marland Kitchen MAGNESIUM PO Take 1 tablet by mouth daily.    . Melatonin 10 MG TABS Take by mouth.    . Multiple Vitamin (MULTIVITAMIN ADULT PO) Take by mouth.    . Multiple Vitamins-Minerals (DESICCATED LIVER/B-12 PO) Take by mouth.    . Omega-3 Fatty Acids (FISH OIL PO) Take 1 capsule by mouth daily.    . TURMERIC PO Take by mouth.    Marland Kitchen UNABLE TO FIND Med Name: Collegan 1 tsp    . vitamin B-12 (CYANOCOBALAMIN)  1000 MCG tablet Take 1,000 mcg by mouth daily.     No current facility-administered medications for this visit.     PHYSICAL EXAMINATION: ECOG PERFORMANCE STATUS: 1 - Symptomatic but completely ambulatory Vitals:   07/09/20 1419  BP: 139/84  Pulse: 76  Resp: 18  Temp: 98.7 F (37.1 C)   Filed Weights   07/09/20 1419  Weight: 170 lb 6.4 oz (77.3 kg)    Physical Exam Constitutional:      General: She is not in acute distress. HENT:     Head: Normocephalic and atraumatic.  Eyes:     General: No scleral icterus. Cardiovascular:     Rate and Rhythm: Normal rate and regular rhythm.     Heart sounds: Normal heart sounds.  Pulmonary:     Effort: Pulmonary effort is normal. No respiratory distress.     Breath sounds: No wheezing.  Abdominal:     General: Bowel sounds are normal. There is no distension.     Palpations: Abdomen is soft.  Musculoskeletal:  General: No deformity. Normal range of motion.     Cervical back: Normal range of motion and neck supple.  Skin:    General: Skin is warm and dry.     Findings: No erythema or rash.  Neurological:     Mental Status: She is alert and oriented to person, place, and time. Mental status is at baseline.     Cranial Nerves: No cranial nerve deficit.     Coordination: Coordination normal.  Psychiatric:        Mood and Affect: Mood normal.     LABORATORY DATA:  I have reviewed the data as listed Lab Results  Component Value Date   WBC 3.5 (L) 07/02/2020   HGB 12.1 07/02/2020   HCT 34.1 (L) 07/02/2020   MCV 96.1 07/02/2020   PLT 124 (L) 07/02/2020   Recent Labs    03/21/20 1215 07/02/20 1527  NA 138 139  K 4.1 4.1  CL 104 106  CO2 28 23  GLUCOSE 104* 105*  BUN 21 24*  CREATININE 1.06* 1.02*  CALCIUM 9.5 9.0  GFRNONAA 58* >60  PROT 7.3 7.3  ALBUMIN 4.4 4.5  AST 20 18  ALT 14 13  ALKPHOS 37* 38  BILITOT 0.6 0.5   Iron/TIBC/Ferritin/ %Sat No results found for: IRON, TIBC, FERRITIN, IRONPCTSAT     RADIOGRAPHIC STUDIES: I have personally reviewed the radiological images as listed and agreed with the findings in the report. No results found.    ASSESSMENT & PLAN:  1. Leukopenia, unspecified type   2. AML (acute myeloid leukemia) in remission ([place])   3. Thrombocytopenia ([place])    #History of AML, Clinically she appears doing very well.  No constitutional symptoms Labs reviewed and discussed with patient. Mild leukopenia with complete normal differential. Thrombocytopenia has improved. Peripheral blood flow cytometry is negative Discussed about the surveillance plan with patient.  Recommend continue CBC every 3 months for the first 2 to 3 years then every 6 months up to 5 years Bone marrow biopsy as clinically indicated.  She agrees with the plan . Orders Placed This Encounter  Procedures  . CBC with Differential/Platelet    Standing Status:   Future    Standing Expiration Date:   07/09/2021  . Comprehensive metabolic panel    Standing Status:   Future    Standing Expiration Date:   07/09/2021  . Lactate dehydrogenase    Standing Status:   Future    Standing Expiration Date:   07/09/2021  . Pathologist smear review    Standing Status:   Future    Standing Expiration Date:   07/09/2021    All questions were answered. The patient knows to call the clinic with any problems questions or concerns.  cc Kirk Ruths, MD    Return of visit: 3 months.    Earlie Server, MD, PhD Hematology Oncology [facility] Pager- [phone] 07/09/2020

## 2020-07-09 NOTE — Progress Notes (Signed)
Patient here for follow up. No new concerns voiced.  °

## 2020-10-08 ENCOUNTER — Other Ambulatory Visit: Payer: Self-pay

## 2020-10-08 ENCOUNTER — Inpatient Hospital Stay (HOSPITAL_BASED_OUTPATIENT_CLINIC_OR_DEPARTMENT_OTHER): Payer: Medicare HMO | Admitting: Oncology

## 2020-10-08 ENCOUNTER — Encounter: Payer: Self-pay | Admitting: Oncology

## 2020-10-08 ENCOUNTER — Inpatient Hospital Stay: Payer: Medicare HMO | Attending: Oncology

## 2020-10-08 VITALS — BP 118/72 | HR 57 | Temp 99.2°F | Resp 18 | Wt 166.8 lb

## 2020-10-08 DIAGNOSIS — D72819 Decreased white blood cell count, unspecified: Secondary | ICD-10-CM

## 2020-10-08 DIAGNOSIS — D696 Thrombocytopenia, unspecified: Secondary | ICD-10-CM

## 2020-10-08 DIAGNOSIS — C9201 Acute myeloblastic leukemia, in remission: Secondary | ICD-10-CM

## 2020-10-08 LAB — LACTATE DEHYDROGENASE: LDH: 129 U/L (ref 98–192)

## 2020-10-08 LAB — CBC WITH DIFFERENTIAL/PLATELET
Abs Immature Granulocytes: 0.01 10*3/uL (ref 0.00–0.07)
Basophils Absolute: 0 10*3/uL (ref 0.0–0.1)
Basophils Relative: 0 %
Eosinophils Absolute: 0.1 10*3/uL (ref 0.0–0.5)
Eosinophils Relative: 2 %
HCT: 32.8 % — ABNORMAL LOW (ref 36.0–46.0)
Hemoglobin: 11.7 g/dL — ABNORMAL LOW (ref 12.0–15.0)
Immature Granulocytes: 0 %
Lymphocytes Relative: 20 %
Lymphs Abs: 0.6 10*3/uL — ABNORMAL LOW (ref 0.7–4.0)
MCH: 34.5 pg — ABNORMAL HIGH (ref 26.0–34.0)
MCHC: 35.7 g/dL (ref 30.0–36.0)
MCV: 96.8 fL (ref 80.0–100.0)
Monocytes Absolute: 0.4 10*3/uL (ref 0.1–1.0)
Monocytes Relative: 12 %
Neutro Abs: 2 10*3/uL (ref 1.7–7.7)
Neutrophils Relative %: 66 %
Platelets: 104 10*3/uL — ABNORMAL LOW (ref 150–400)
RBC: 3.39 MIL/uL — ABNORMAL LOW (ref 3.87–5.11)
RDW: 12.3 % (ref 11.5–15.5)
WBC: 3.1 10*3/uL — ABNORMAL LOW (ref 4.0–10.5)
nRBC: 0 % (ref 0.0–0.2)

## 2020-10-08 LAB — COMPREHENSIVE METABOLIC PANEL
ALT: 14 U/L (ref 0–44)
AST: 19 U/L (ref 15–41)
Albumin: 4.3 g/dL (ref 3.5–5.0)
Alkaline Phosphatase: 34 U/L — ABNORMAL LOW (ref 38–126)
Anion gap: 8 (ref 5–15)
BUN: 25 mg/dL — ABNORMAL HIGH (ref 8–23)
CO2: 26 mmol/L (ref 22–32)
Calcium: 9.2 mg/dL (ref 8.9–10.3)
Chloride: 106 mmol/L (ref 98–111)
Creatinine, Ser: 1.05 mg/dL — ABNORMAL HIGH (ref 0.44–1.00)
GFR, Estimated: 58 mL/min — ABNORMAL LOW (ref >60)
Glucose, Bld: 107 mg/dL — ABNORMAL HIGH (ref 70–99)
Potassium: 4.3 mmol/L (ref 3.5–5.1)
Sodium: 140 mmol/L (ref 135–145)
Total Bilirubin: 0.6 mg/dL (ref 0.3–1.2)
Total Protein: 6.8 g/dL (ref 6.5–8.1)

## 2020-10-08 LAB — PATHOLOGIST SMEAR REVIEW

## 2020-10-08 NOTE — Progress Notes (Signed)
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   HISTORY OF PRESENTING ILLNESS:   Jenna Wiley is a  70 y.o.  female with PMH listed below was seen in consultation at the request of  Kirk Ruths, MD  for evaluation of AML.   I obtained patient's past medical history and extensive medical record review was performed.  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  Patient received leukemia chemotherapy at [place] [facility] 08/2018 -09/05/2018 7+3 regimen. Day 14 marrow showed blasts <5%,  09/08/2018 bone marrow biopsy reviewed hypocellular marrow with 5-7% blasts, consistent with marrow recovery.  Office notes indicate that MRD testing was negative on the specimen.  NGS was also negative for WT1. 09/2018-10/24/2018 HIDAC x 2 cycles 11/02/2018 - 11/16/2018 Febrile neutropenia, strep viridans as well as aortic valve endocarditis with AV vegetation. She finished extended course of IV antibitotics.   12/12/2018 Bone marrow biopsy 10-20% cellularity, CR.  MRD by flow cytometry was negative. Concesus decision was made to forego further consolidation treatments. With plan for series CBC monitoring.   She moved to and followed up with Dr.Millenson [place] and have CBC monitored periodically.   05/2019 COVID 19 infection, managed with supportive care at home and symptoms resolved within 2 weeks.   10/15/2019 seen by Dr.Millenson. She was recommended for COVID vaccination and deferred at that time.  Blood work showed Wbc 3.8, Hb 11.2, hct 32.9, MCV 101, platelet 78000, neutrophil 76%, lymphocyte 11.7% ANC 2.9  Patient has moved to  [facility] since then.  Today she reports feeling well.  No fever, chills, night sweats, unintentional weight loss.  Appetite is good.  She has establish care with primary care provider Dr. Ouida Sills and was seen on 03/05/2020.  Patient was referred to establish care with hematology for surveillance.   INTERVAL HISTORY Jenna Wiley is a 70 y.o. female who has above history reviewed by me today presents for follow up visit for management of history of AML, chronic thrombocytopenia Problems and complaints are listed below: Patient has no new complaints today denies any fever, night sweats,un intentional weight loss..  Review of Systems  Constitutional: Negative for appetite change, chills, fatigue and fever.  HENT:   Negative for hearing loss and voice change.   Eyes: Negative for eye problems.  Respiratory: Negative for chest tightness and cough.   Cardiovascular: Negative for chest pain.  Gastrointestinal: Negative for abdominal distention, abdominal pain and blood in stool.  Endocrine: Negative for hot flashes.  Genitourinary: Negative for difficulty urinating and frequency.   Musculoskeletal: Negative for arthralgias.  Skin: Negative for itching and rash.  Neurological: Negative for extremity weakness.  Hematological: Negative for adenopathy.  Psychiatric/Behavioral: Negative for confusion.    MEDICAL HISTORY:  Past Medical History:  Diagnosis Date  . Cancer ([place]) 07/09/2018   AML leukemia  . Hypertension   . Osteopenia     SURGICAL HISTORY: Past Surgical History:  Procedure Laterality Date  . AUGMENTATION MAMMAPLASTY Bilateral    2006; replaced 2017  . BREAST IMPLANT REMOVAL  2017  . CESAREAN SECTION  1983  . PERCUTANEOUS FIXATION TIBIAL SHAFT FRACTURE W/ PINS / SCREWS  2018   pin in thumb  . PLACEMENT OF BREAST  IMPLANTS  2006    SOCIAL HISTORY: Social History   Socioeconomic History  . Marital status: Married    Spouse name: Not on file  . Number of  children: Not on file  . Years of education: Not on file  . Highest education level: Not on file  Occupational History  . Not on file  Tobacco Use  . Smoking status: Never Smoker  . Smokeless tobacco: Never Used  Vaping Use  . Vaping Use: Never used  Substance and Sexual Activity  . Alcohol use: Yes    Comment: social drinker  . Drug use: Not Currently  . Sexual activity: Yes  Other Topics Concern  . Not on file  Social History Narrative  . Not on file   Social Determinants of Health   Financial Resource Strain: Not on file  Food Insecurity: Not on file  Transportation Needs: Not on file  Physical Activity: Not on file  Stress: Not on file  Social Connections: Not on file  Intimate Partner Violence: Not on file    FAMILY HISTORY: Family History  Problem Relation Age of Onset  . Brain cancer Mother   . Liver cancer Father   . Hypertension Father   . Breast cancer Neg Hx     ALLERGIES:  is allergic to nitrofurantoin.  MEDICATIONS:  Current Outpatient Medications  Medication Sig Dispense Refill  . amLODipine (NORVASC) 5 MG tablet Take 1 tablet by mouth daily.    Marland Kitchen atorvastatin (LIPITOR) 10 MG tablet Take 10 mg by mouth daily.    . calcium citrate-vitamin D (CITRACAL+D) 315-200 MG-UNIT tablet Take by mouth.    . Cholecalciferol 50 MCG (2000 UT) TABS Take by mouth.    . diphenhydrAMINE (BENADRYL) 25 mg capsule Take 25 mg by mouth every 6 (six) hours as needed.    Marland Kitchen MAGNESIUM PO Take 1 tablet by mouth daily.    . Melatonin 10 MG TABS Take by mouth.    . Multiple Vitamin (MULTIVITAMIN ADULT PO) Take by mouth.    . Multiple Vitamins-Minerals (DESICCATED LIVER/B-12 PO) Take by mouth.    . Omega-3 Fatty Acids (FISH OIL PO) Take 1 capsule by mouth daily.    . TURMERIC PO Take by mouth.    Marland Kitchen UNABLE TO FIND Med Name: Collegan 1 tsp    . vitamin B-12 (CYANOCOBALAMIN) 1000 MCG tablet Take 1,000 mcg by mouth daily.     No current facility-administered medications for this  visit.     PHYSICAL EXAMINATION: ECOG PERFORMANCE STATUS: 1 - Symptomatic but completely ambulatory Vitals:   10/08/20 1340  BP: 118/72  Pulse: (!) 57  Resp: 18  Temp: 99.2 F (37.3 C)   Filed Weights   10/08/20 1340  Weight: 166 lb 12.8 oz (75.7 kg)    Physical Exam Constitutional:      General: She is not in acute distress. HENT:     Head: Normocephalic and atraumatic.  Eyes:     General: No scleral icterus. Cardiovascular:     Rate and Rhythm: Normal rate and regular rhythm.     Heart sounds: Normal heart sounds.  Pulmonary:     Effort: Pulmonary effort is normal. No respiratory distress.     Breath sounds: No wheezing.  Abdominal:     General: Bowel sounds are normal. There is no distension.     Palpations: Abdomen is soft.  Musculoskeletal:        General: No deformity. Normal range of motion.     Cervical  back: Normal range of motion and neck supple.  Skin:    General: Skin is warm and dry.     Findings: No erythema or rash.  Neurological:     Mental Status: She is alert and oriented to person, place, and time. Mental status is at baseline.     Cranial Nerves: No cranial nerve deficit.     Coordination: Coordination normal.  Psychiatric:        Mood and Affect: Mood normal.     LABORATORY DATA:  I have reviewed the data as listed Lab Results  Component Value Date   WBC 3.1 (L) 10/08/2020   HGB 11.7 (L) 10/08/2020   HCT 32.8 (L) 10/08/2020   MCV 96.8 10/08/2020   PLT 104 (L) 10/08/2020   Recent Labs    03/21/20 1215 07/02/20 1527 10/08/20 1258  NA 138 139 140  K 4.1 4.1 4.3  CL 104 106 106  CO2 $Re'28 23 26  'IlM$ GLUCOSE 104* 105* 107*  BUN 21 24* 25*  CREATININE 1.06* 1.02* 1.05*  CALCIUM 9.5 9.0 9.2  GFRNONAA 58* >60 58*  PROT 7.3 7.3 6.8  ALBUMIN 4.4 4.5 4.3  AST $Re'20 18 19  'gYf$ ALT $R'14 13 14  'KW$ ALKPHOS 37* 38 34*  BILITOT 0.6 0.5 0.6   Iron/TIBC/Ferritin/ %Sat No results found for: IRON, TIBC, FERRITIN, IRONPCTSAT    RADIOGRAPHIC STUDIES: I  have personally reviewed the radiological images as listed and agreed with the findings in the report. No results found.    ASSESSMENT & PLAN:  1. AML (acute myeloid leukemia) in remission ([place])   2. Thrombocytopenia ([place])    #History of AML, Clinically she is doing well.  No constitutional symptoms Labs are reviewed and discussed with patient in details She continues to have mild leukopenia with predominantly lymphocytopenia.  Previous flow cytometry was negative Pathology smear showed normal morphology of RBC, WBC, platelet.  No evidence of circulating blasts . Thrombocytopenia mild, 10 4000.  Continue to monitor..  Discussed about the surveillance plan with patient.  Recommend continue CBC every 3 months for the first 2 to 3 years then every 6 months up to 5 yearsBone marrow biopsy as clinically indicated.  She agrees with the plan . Orders Placed This Encounter  Procedures  . CBC with Differential/Platelet    Standing Status:   Future    Standing Expiration Date:   10/08/2021  . Comprehensive metabolic panel    Standing Status:   Future    Standing Expiration Date:   10/08/2021  . Lactate dehydrogenase    Standing Status:   Future    Standing Expiration Date:   10/08/2021  . Pathologist smear review    Standing Status:   Future    Standing Expiration Date:   10/08/2021  . Flow cytometry panel-leukemia/lymphoma work-up    Standing Status:   Future    Standing Expiration Date:   10/08/2021    All questions were answered. The patient knows to call the clinic with any problems questions or concerns.  cc Kirk Ruths, MD    Return of visit: 3 months.  Lab MD   Earlie Server, MD, PhD Hematology Oncology [facility] at [facility] Pager- [phone] 10/08/2020

## 2020-10-08 NOTE — Progress Notes (Signed)
Pt here for follow up. No new concerns voiced.   

## 2020-10-15 ENCOUNTER — Emergency Department
Admission: EM | Admit: 2020-10-15 | Discharge: 2020-10-15 | Disposition: A | Discharge status: Home / Self Care | Payer: Medicare HMO | Attending: Emergency Medicine | Admitting: Emergency Medicine

## 2020-10-15 ENCOUNTER — Encounter: Payer: Self-pay | Admitting: Emergency Medicine

## 2020-10-15 ENCOUNTER — Emergency Department: Payer: Medicare HMO

## 2020-10-15 ENCOUNTER — Other Ambulatory Visit: Payer: Self-pay

## 2020-10-15 DIAGNOSIS — W010XXD Fall on same level from slipping, tripping and stumbling without subsequent striking against object, subsequent encounter: Secondary | ICD-10-CM | POA: Insufficient documentation

## 2020-10-15 DIAGNOSIS — Z856 Personal history of leukemia: Secondary | ICD-10-CM | POA: Diagnosis not present

## 2020-10-15 DIAGNOSIS — Z79899 Other long term (current) drug therapy: Secondary | ICD-10-CM | POA: Diagnosis not present

## 2020-10-15 DIAGNOSIS — I10 Essential (primary) hypertension: Secondary | ICD-10-CM | POA: Insufficient documentation

## 2020-10-15 DIAGNOSIS — S43004D Unspecified dislocation of right shoulder joint, subsequent encounter: Secondary | ICD-10-CM | POA: Insufficient documentation

## 2020-10-15 DIAGNOSIS — S4991XD Unspecified injury of right shoulder and upper arm, subsequent encounter: Secondary | ICD-10-CM | POA: Diagnosis present

## 2020-10-15 MED ORDER — LIDOCAINE HCL (PF) 1 % IJ SOLN
30.0000 mL | Freq: Once | INTRAMUSCULAR | Status: AC
  Administered 2020-10-15: 30 mL
  Filled 2020-10-15: qty 30

## 2020-10-15 MED ORDER — OXYCODONE-ACETAMINOPHEN 5-325 MG PO TABS
1.0000 | ORAL_TABLET | Freq: Once | ORAL | Status: AC
Start: 2020-10-15 — End: 2020-10-15
  Administered 2020-10-15: 1 via ORAL
  Filled 2020-10-15: qty 1

## 2020-10-15 MED ORDER — ONDANSETRON 4 MG PO TBDP
4.0000 mg | ORAL_TABLET | Freq: Once | ORAL | Status: AC
  Administered 2020-10-15: 4 mg via ORAL
  Filled 2020-10-15: qty 1

## 2020-10-15 NOTE — ED Triage Notes (Signed)
Pt to ED via POV with c/o dislocated R shoulder, states hx of same, pt states tripped and put arms out to catch herself.

## 2020-10-15 NOTE — ED Notes (Signed)
X-ray at bedside

## 2020-10-15 NOTE — ED Provider Notes (Signed)
[facility] Emergency Department Provider Note   ____________________________________________    I have reviewed the triage vital signs and the nursing notes.   HISTORY  Chief Complaint Shoulder Injury     HPI Jenna Wiley is a 70 y.o. female with history of leukemia and osteopenia who presents after a fall.  Patient reports she tripped fell forward and injured her right shoulder.  She reports several months ago she dislocated her right shoulder and this feels the same.  She complains only of pain in her right shoulder.  No head injury.  No abdominal pain no chest pain no lower extremity injuries.  No back pain.  Reviewed medical records, patient had shoulder reduction on May 30, 2020 in our emergency department, performed with intra-articular lidocaine  Past Medical History:  Diagnosis Date  . Cancer ([place]) 07/09/2018   AML leukemia  . Hypertension   . Osteopenia     Patient Active Problem List   Diagnosis Date Noted  . Goals of care, counseling/discussion 04/12/2020  . Macrocytic anemia 03/21/2020  . Leukopenia 03/21/2020  . 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 Essential hypertension 03/05/2020  . Hypercholesterolemia 03/05/2020    Past Surgical History:  Procedure Laterality Date  . AUGMENTATION MAMMAPLASTY Bilateral    2006; replaced 2017  . BREAST IMPLANT REMOVAL  2017  . CESAREAN SECTION  1983  . PERCUTANEOUS FIXATION TIBIAL SHAFT FRACTURE W/ PINS / SCREWS  2018   pin in thumb  . PLACEMENT OF BREAST IMPLANTS  2006    Prior to Admission medications   Medication Sig Start Date End Date Taking? Authorizing Provider  amLODipine (NORVASC) 5 MG tablet Take 1 tablet by mouth daily. 01/24/20   [provider]  atorvastatin (LIPITOR) 10 MG tablet Take 10 mg by mouth daily.    [provider]  calcium citrate-vitamin D (CITRACAL+D) 315-200 MG-UNIT tablet Take by mouth.    [provider]  Cholecalciferol 50 MCG (2000 UT) TABS Take by mouth.    [provider]  diphenhydrAMINE (BENADRYL) 25 mg capsule Take 25 mg by mouth every 6 (six) hours as needed.    [provider]  MAGNESIUM PO Take 1 tablet by mouth daily.    [provider]  Melatonin 10 MG TABS Take by mouth.    [provider]  Multiple Vitamin (MULTIVITAMIN ADULT PO) Take by mouth.    [provider]  Multiple Vitamins-Minerals (DESICCATED LIVER/B-12 PO) Take by mouth.    [provider]  Omega-3 Fatty Acids (FISH OIL PO) Take 1 capsule by mouth daily.    [provider]  TURMERIC PO Take by mouth.    [provider]  UNABLE TO FIND Med Name: [place] 1 tsp    [provider]  vitamin B-12 (CYANOCOBALAMIN) 1000 MCG tablet Take 1,000 mcg by mouth daily.    [provider]     Allergies Nitrofurantoin  Family History  Problem Relation Age of Onset  . Brain cancer Mother   . Liver cancer Father   . Hypertension Father   . Breast cancer Neg Hx     Social History Social History   Tobacco Use  . Smoking status: Never Smoker  . Smokeless tobacco: Never Used  Vaping Use  . Vaping Use: Never used  Substance Use Topics  . Alcohol use: Yes    Comment: social drinker  . Drug use: Not Currently    Review of Systems  Constitutional:  No dizziness Eyes: No injury ENT: No facial injury Cardiovascular: Denies chest pain. Respiratory: Denies shortness of breath. Gastrointestinal: No abdominal pain.  Genitourinary: Negative for dysuria. Musculoskeletal: As above Skin: Negative for laceration or abrasion Neurological: Negative for headaches or weakness   ____________________________________________   PHYSICAL EXAM:  VITAL SIGNS: ED Triage Vitals  Enc Vitals Group     BP 10/15/20 0739 (!) 156/90     Pulse Rate 10/15/20 0739 63     Resp 10/15/20 0739 18     Temp 10/15/20 0739 97.7 F (36.5 C)     Temp  Source 10/15/20 0739 Oral     SpO2 10/15/20 0739 100 %     Weight 10/15/20 0735 75.7 kg (166 lb 14.2 oz)     Height 10/15/20 0735 1.651 m (5\' 5" )     Head Circumference --      Peak Flow --      Pain Score 10/15/20 0734 10     Pain Loc --      Pain Edu? --      Excl. in [place]? --     Constitutional: Alert and oriented. No acute distress. Pleasant and interactive  Mouth/Throat: Mucous membranes are moist.   Neck:  Painless ROM Cardiovascular: Normal rate, regular rhythm.  Good peripheral circulation. Respiratory: Normal respiratory effort.  No retractions.  Gastrointestinal:  nontender. No distention.   Genitourinary: deferred Musculoskeletal: Right shoulder: Able to palpate humeral head anterior to the right shoulder, suspect anterior dislocation, warm and well-perfused distally Neurologic:  Normal speech and language. No gross focal neurologic deficits are appreciated.  Skin:  Skin is warm, dry and intact.  Psychiatric: Mood and affect are normal. Speech and behavior are normal.  ____________________________________________   LABS (all labs ordered are listed, but only abnormal results are displayed)  Labs Reviewed - No data to display ____________________________________________  EKG  None ____________________________________________  RADIOLOGY  Shoulder x-ray reviewed by me ____________________________________________   PROCEDURES  Procedure(s) performed: yes  Injection of joint  Date/Time: 10/15/2020 8:50 AM Performed by: Lavonia Drafts, MD Authorized by: Lavonia Drafts, MD  Consent: Verbal consent obtained. Consent given by: patient Patient understanding: patient states understanding of the procedure being performed Imaging studies: imaging studies available Patient identity confirmed: verbally with patient Preparation: Patient was prepped and draped in the usual sterile fashion. Local anesthesia used: yes Anesthesia: local  infiltration  Anesthesia: Local anesthesia used: yes Local Anesthetic: lidocaine 1% without epinephrine Anesthetic total: 18 mL  Sedation: Patient sedated: no  Patient tolerance: patient tolerated the procedure well with no immediate complications  .Ortho Injury Treatment  Date/Time: 10/15/2020 9:12 AM Performed by: Lavonia Drafts, MD Authorized by: Lavonia Drafts, MD   Consent:    Consent obtained:  Verbal   Consent given by:  PatientInjury location: shoulder Location details: right shoulder Injury type: dislocation Dislocation type: anterior Hill-Sachs deformity: no Chronicity: recurrent Pre-procedure neurovascular assessment: neurovascularly intact Pre-procedure distal perfusion: normal Pre-procedure neurological function: normal Pre-procedure range of motion: normal  Patient sedated: NoManipulation performed: yes Reduction method: external rotation Reduction successful: yes X-ray confirmed reduction: yes Immobilization: sling Splint Applied by: ED Nurse Post-procedure neurovascular assessment: post-procedure neurovascularly intact Post-procedure distal perfusion: normal Post-procedure neurological function: normal Post-procedure range of motion: normal         Critical Care performed: No ____________________________________________   INITIAL IMPRESSION / ASSESSMENT AND PLAN / ED COURSE  Pertinent labs & imaging results that were available during my care of the patient were reviewed by me and considered  in my medical decision making (see chart for details).  Patient presents with fall as described above, exam is most consistent with anterior dislocation, pending x-ray  X-ray reviewed by me, consistent with anterior dislocation.  Intra-articular lidocaine injected, p.o. Percocet and Zofran as patient refused IV  ----------------------------------------- 9:27 AM on 10/15/2020 -----------------------------------------  Shoulder successfully reduced,  confirmed by x-ray, outpatient follow-up with orthopedics    ____________________________________________   FINAL CLINICAL IMPRESSION(S) / ED DIAGNOSES  Final diagnoses:  Dislocation of right shoulder joint, subsequent encounter        Note:  This document was prepared using Dragon voice recognition software and may include unintentional dictation errors.   Lavonia Drafts, MD 10/15/20 [phone]

## 2020-10-15 NOTE — ED Notes (Signed)
Post right shoulder reduction performed with Corky Downs, MD. Pt tolerated well.

## 2020-10-15 NOTE — ED Notes (Signed)
Helped pt to toilet and back to bed. Pt voided.

## 2020-10-15 NOTE — ED Notes (Signed)
Pt placed in sling post right shoulder reduction. Xray called for post reduction xray.

## 2020-10-17 ENCOUNTER — Ambulatory Visit
Admission: RE | Admit: 2020-10-17 | Discharge: 2020-10-17 | Disposition: A | Discharge status: Home / Self Care | Payer: Medicare HMO | Source: Ambulatory Visit | Attending: Orthopedic Surgery | Admitting: Orthopedic Surgery

## 2020-10-17 ENCOUNTER — Other Ambulatory Visit: Payer: Self-pay | Admitting: Orthopedic Surgery

## 2020-10-17 ENCOUNTER — Other Ambulatory Visit: Payer: Self-pay

## 2020-10-17 DIAGNOSIS — S46011A Strain of muscle(s) and tendon(s) of the rotator cuff of right shoulder, initial encounter: Secondary | ICD-10-CM

## 2020-10-30 ENCOUNTER — Other Ambulatory Visit: Payer: Self-pay | Admitting: Orthopedic Surgery

## 2020-11-03 ENCOUNTER — Encounter: Payer: Self-pay | Admitting: Orthopedic Surgery

## 2020-11-12 ENCOUNTER — Other Ambulatory Visit: Payer: Self-pay | Admitting: Orthopedic Surgery

## 2020-11-14 ENCOUNTER — Ambulatory Visit
Admission: RE | Admit: 2020-11-14 | Discharge: 2020-11-14 | Disposition: A | Discharge status: Home / Self Care | Payer: Medicare HMO | Attending: Orthopedic Surgery | Admitting: Orthopedic Surgery

## 2020-11-14 ENCOUNTER — Encounter
Admission: RE | Disposition: A | Discharge status: Home / Self Care | Payer: Self-pay | Source: Home / Self Care | Attending: Orthopedic Surgery

## 2020-11-14 ENCOUNTER — Ambulatory Visit: Payer: Medicare HMO | Admitting: Anesthesiology

## 2020-11-14 ENCOUNTER — Other Ambulatory Visit: Payer: Self-pay

## 2020-11-14 ENCOUNTER — Encounter: Payer: Self-pay | Admitting: Orthopedic Surgery

## 2020-11-14 DIAGNOSIS — Z888 Allergy status to other drugs, medicaments and biological substances status: Secondary | ICD-10-CM | POA: Insufficient documentation

## 2020-11-14 DIAGNOSIS — Z8051 Family history of malignant neoplasm of kidney: Secondary | ICD-10-CM | POA: Diagnosis not present

## 2020-11-14 DIAGNOSIS — Z9886 Personal history of breast implant removal: Secondary | ICD-10-CM | POA: Insufficient documentation

## 2020-11-14 DIAGNOSIS — Z808 Family history of malignant neoplasm of other organs or systems: Secondary | ICD-10-CM | POA: Insufficient documentation

## 2020-11-14 DIAGNOSIS — C9201 Acute myeloblastic leukemia, in remission: Secondary | ICD-10-CM | POA: Diagnosis not present

## 2020-11-14 DIAGNOSIS — M24411 Recurrent dislocation, right shoulder: Secondary | ICD-10-CM | POA: Insufficient documentation

## 2020-11-14 DIAGNOSIS — Z800 Family history of malignant neoplasm of digestive organs: Secondary | ICD-10-CM | POA: Insufficient documentation

## 2020-11-14 DIAGNOSIS — Z79899 Other long term (current) drug therapy: Secondary | ICD-10-CM | POA: Insufficient documentation

## 2020-11-14 DIAGNOSIS — W19XXXA Unspecified fall, initial encounter: Secondary | ICD-10-CM | POA: Insufficient documentation

## 2020-11-14 DIAGNOSIS — Z8619 Personal history of other infectious and parasitic diseases: Secondary | ICD-10-CM | POA: Insufficient documentation

## 2020-11-14 DIAGNOSIS — M75111 Incomplete rotator cuff tear or rupture of right shoulder, not specified as traumatic: Secondary | ICD-10-CM | POA: Insufficient documentation

## 2020-11-14 DIAGNOSIS — Z98891 History of uterine scar from previous surgery: Secondary | ICD-10-CM | POA: Insufficient documentation

## 2020-11-14 HISTORY — PX: SHOULDER ARTHROSCOPY WITH BANKART REPAIR: SHX5673

## 2020-11-14 SURGERY — SHOULDER ARTHROSCOPY WITH BANKART REPAIR
Anesthesia: Regional | Site: Shoulder | Laterality: Right

## 2020-11-14 MED ORDER — CEFAZOLIN SODIUM-DEXTROSE 2-4 GM/100ML-% IV SOLN
2.0000 g | INTRAVENOUS | Status: AC
  Administered 2020-11-14: 2 g via INTRAVENOUS

## 2020-11-14 MED ORDER — GLYCOPYRROLATE 0.2 MG/ML IJ SOLN
INTRAMUSCULAR | Status: DC | PRN
  Administered 2020-11-14: .1 mg via INTRAVENOUS

## 2020-11-14 MED ORDER — BUPIVACAINE LIPOSOME 1.3 % IJ SUSP
INTRAMUSCULAR | Status: DC | PRN
  Administered 2020-11-14: 20 mL via PERINEURAL

## 2020-11-14 MED ORDER — MIDAZOLAM HCL 5 MG/5ML IJ SOLN
INTRAMUSCULAR | Status: DC | PRN
  Administered 2020-11-14: 1 mg via INTRAVENOUS

## 2020-11-14 MED ORDER — OXYCODONE HCL 5 MG PO TABS
5.0000 mg | ORAL_TABLET | Freq: Once | ORAL | Status: DC | PRN
Start: 2020-11-14 — End: 2020-11-14

## 2020-11-14 MED ORDER — LACTATED RINGERS IR SOLN
Status: DC | PRN
  Administered 2020-11-14: 4000 mL

## 2020-11-14 MED ORDER — ASPIRIN EC 325 MG PO TBEC: 325.0000 mg | DELAYED_RELEASE_TABLET | Freq: Every day | ORAL | 0 refills | Status: AC

## 2020-11-14 MED ORDER — FENTANYL CITRATE (PF) 100 MCG/2ML IJ SOLN: 25.0000 ug | INTRAMUSCULAR | Status: DC | PRN

## 2020-11-14 MED ORDER — OXYCODONE HCL 5 MG PO TABS: 5.0000 mg | ORAL_TABLET | ORAL | 0 refills | Status: DC | PRN

## 2020-11-14 MED ORDER — ACETAMINOPHEN 160 MG/5ML PO SOLN: 325.0000 mg | ORAL | Status: DC | PRN

## 2020-11-14 MED ORDER — ACETAMINOPHEN 500 MG PO TABS: 1000.0000 mg | ORAL_TABLET | Freq: Three times a day (TID) | ORAL | 2 refills | Status: DC

## 2020-11-14 MED ORDER — OXYCODONE HCL 5 MG/5ML PO SOLN: 5.0000 mg | Freq: Once | ORAL | Status: DC | PRN

## 2020-11-14 MED ORDER — DEXAMETHASONE SODIUM PHOSPHATE 4 MG/ML IJ SOLN
INTRAMUSCULAR | Status: DC | PRN
  Administered 2020-11-14: 4 mg via INTRAVENOUS

## 2020-11-14 MED ORDER — LACTATED RINGERS IV SOLN
INTRAVENOUS | Status: DC | PRN
  Administered 2020-11-14: 4000 mL

## 2020-11-14 MED ORDER — LIDOCAINE HCL (CARDIAC) PF 100 MG/5ML IV SOSY
PREFILLED_SYRINGE | INTRAVENOUS | Status: DC | PRN
  Administered 2020-11-14: 50 mg via INTRATRACHEAL

## 2020-11-14 MED ORDER — LACTATED RINGERS IV SOLN: INTRAVENOUS | Status: DC

## 2020-11-14 MED ORDER — ONDANSETRON 4 MG PO TBDP: 4.0000 mg | ORAL_TABLET | Freq: Three times a day (TID) | ORAL | 0 refills | Status: DC | PRN

## 2020-11-14 MED ORDER — ACETAMINOPHEN 325 MG PO TABS: 325.0000 mg | ORAL_TABLET | ORAL | Status: DC | PRN

## 2020-11-14 MED ORDER — FENTANYL CITRATE (PF) 100 MCG/2ML IJ SOLN
INTRAMUSCULAR | Status: DC | PRN
  Administered 2020-11-14: 50 ug via INTRAVENOUS

## 2020-11-14 MED ORDER — PROPOFOL 10 MG/ML IV BOLUS
INTRAVENOUS | Status: DC | PRN
  Administered 2020-11-14: 130 mg via INTRAVENOUS

## 2020-11-14 MED ORDER — BUPIVACAINE HCL (PF) 0.5 % IJ SOLN
INTRAMUSCULAR | Status: DC | PRN
  Administered 2020-11-14: 20 mL via PERINEURAL

## 2020-11-14 MED ORDER — EPHEDRINE SULFATE 50 MG/ML IJ SOLN
INTRAMUSCULAR | Status: DC | PRN
  Administered 2020-11-14: 5 mg via INTRAVENOUS
  Administered 2020-11-14: 10 mg via INTRAVENOUS
  Administered 2020-11-14: 5 mg via INTRAVENOUS

## 2020-11-14 MED ORDER — ONDANSETRON HCL 4 MG/2ML IJ SOLN
INTRAMUSCULAR | Status: DC | PRN
  Administered 2020-11-14: 4 mg via INTRAVENOUS

## 2020-11-14 SURGICAL SUPPLY — 53 items
ADAPTER IRRIG TUBE 2 SPIKE SOL (ADAPTER) ×4 IMPLANT
ADH SKN CLS APL DERMABOND .7 (GAUZE/BANDAGES/DRESSINGS) ×1
ADPR TBG 2 SPK PMP STRL ASCP (ADAPTER) ×2
ANCHOR SUT 1.8 FBRTK KNTLS 2SU (Anchor) ×3 IMPLANT
APL PRP STRL LF DISP 70% ISPRP (MISCELLANEOUS) ×1
BUR BR 5.5 12 FLUTE (BURR) ×2 IMPLANT
BUR RADIUS 4.0X18.5 (BURR) ×2 IMPLANT
CANNULA PART THRD DISP 5.75X7 (CANNULA) ×1 IMPLANT
CANNULA PARTIAL THREAD 2X7 (CANNULA) ×1 IMPLANT
CANNULA TWIST IN 8.25X7CM (CANNULA) ×1 IMPLANT
CHLORAPREP W/TINT 26 (MISCELLANEOUS) ×2 IMPLANT
COOLER POLAR GLACIER W/PUMP (MISCELLANEOUS) ×2 IMPLANT
COVER LIGHT HANDLE UNIVERSAL (MISCELLANEOUS) ×4 IMPLANT
DERMABOND ADVANCED (GAUZE/BANDAGES/DRESSINGS) ×1
DERMABOND ADVANCED .7 DNX12 (GAUZE/BANDAGES/DRESSINGS) ×1 IMPLANT
DRAPE IMP U-DRAPE 54X76 (DRAPES) ×4 IMPLANT
DRAPE INCISE IOBAN 66X45 STRL (DRAPES) ×2 IMPLANT
DRAPE U-SHAPE 48X52 POLY STRL (PACKS) ×4 IMPLANT
DRSG TEGADERM 4X4.75 (GAUZE/BANDAGES/DRESSINGS) ×10 IMPLANT
ELECT REM PT RETURN 9FT ADLT (ELECTROSURGICAL) ×2
ELECTRODE REM PT RTRN 9FT ADLT (ELECTROSURGICAL) ×1 IMPLANT
GAUZE SPONGE 4X4 12PLY STRL (GAUZE/BANDAGES/DRESSINGS) ×2 IMPLANT
GAUZE XEROFORM 1X8 LF (GAUZE/BANDAGES/DRESSINGS) ×2 IMPLANT
GLOVE SRG 8 PF TXTR STRL LF DI (GLOVE) ×2 IMPLANT
GLOVE SURG ENC MOIS LTX SZ7.5 (GLOVE) ×7 IMPLANT
GLOVE SURG UNDER POLY LF SZ8 (GLOVE) ×6
GOWN STRL REIN 2XL XLG LVL4 (GOWN DISPOSABLE) ×2 IMPLANT
GOWN STRL REUS W/ TWL LRG LVL3 (GOWN DISPOSABLE) ×1 IMPLANT
GOWN STRL REUS W/TWL LRG LVL3 (GOWN DISPOSABLE) ×4
IV LACTATED RINGER IRRG 3000ML (IV SOLUTION) ×8
IV LR IRRIG 3000ML ARTHROMATIC (IV SOLUTION) ×8 IMPLANT
KIT CVD SPEAR FBRTK 1.8 DRILL (KITS) ×1 IMPLANT
KIT STABILIZATION SHOULDER (MISCELLANEOUS) ×2 IMPLANT
KIT TURNOVER KIT A (KITS) ×2 IMPLANT
LASSO 25 DEG RIGHT QUICKPASS (SUTURE) ×2 IMPLANT
MANIFOLD 4PT FOR NEPTUNE1 (MISCELLANEOUS) ×2 IMPLANT
MASK FACE SPIDER DISP (MASK) ×2 IMPLANT
MAT ABSORB  FLUID 56X50 GRAY (MISCELLANEOUS) ×2
MAT ABSORB FLUID 56X50 GRAY (MISCELLANEOUS) ×2 IMPLANT
NDL SAFETY ECLIPSE 18X1.5 (NEEDLE) ×1 IMPLANT
NEEDLE HYPO 18GX1.5 SHARP (NEEDLE) ×2
PACK ARTHROSCOPY SHOULDER (MISCELLANEOUS) ×2 IMPLANT
PAD WRAPON POLAR SHDR XLG (MISCELLANEOUS) ×1 IMPLANT
PENCIL SMOKE EVACUATOR (MISCELLANEOUS) ×2 IMPLANT
SET TUBE SUCT SHAVER OUTFL 24K (TUBING) ×2 IMPLANT
SPONGE GAUZE 2X2 8PLY STRL LF (GAUZE/BANDAGES/DRESSINGS) ×2 IMPLANT
SUT ETHILON 3-0 (SUTURE) ×2 IMPLANT
SYR 10ML LL (SYRINGE) ×2 IMPLANT
TAPE MICROFOAM 4IN (TAPE) ×2 IMPLANT
TUBING ARTHRO INFLOW-ONLY STRL (TUBING) ×2 IMPLANT
TUBING CONNECTING 10 (TUBING) ×2 IMPLANT
WAND WEREWOLF FLOW 90D (MISCELLANEOUS) ×2 IMPLANT
WRAPON POLAR PAD SHDR XLG (MISCELLANEOUS) ×2

## 2020-11-14 NOTE — Op Note (Signed)
PRE-OP DIAGNOSIS:  1. Right shoulder multiple anterior dislocations  2. Right shoulder partial-thickness rotator cuff tear  POST-OP DIAGNOSIS:  1. Right shoulder multiple anterior dislocations  2. Right shoulder partial-thickness rotator cuff tear  PROCEDURES:  1. Right shoulder arthroscopic anterior labral repair and capsulorraphy 2. Right shoulder extensive glenohumeral debridement  SURGEON: Cato Mulligan, MD  ASSISTANT(S):  Anitra Lauth, PA  ANESTHESIA: Regional + Gen  TOTAL IV FLUIDS: see anesthesia record  ESTIMATED BLOOD LOSS: minimal   DRAINS: None   SPECIMENS: None.   IMPLANTS:  Arthrex - 1.50mm Knotless FiberTak x 3  COMPLICATIONS: None   OPERATIVE FINDINGS:  Examination under anesthesia: A careful examination under anesthesia was performed.  Passive range of motion was: FF: 160; ER at side: 70; ER in abduction: 95; IR in abduction: 50.  Anterior load shift: 2+.  Posterior load shift: 1+.  Sulcus in neutral: 1+.  Sulcus in ER: 1+    Intra-operative findings: A thorough arthroscopic examination of the shoulder was performed.  The findings are: 1. Biceps tendon: Mild erythema 2. Superior labrum: normal 3. Posterior labrum and capsule: Degenerative fraying and partial tear 4. Inferior capsule and inferior recess: patulous capsule 5. Glenoid cartilage surface: Normal 6. Supraspinatus attachment: Articular sided partial-thickness tearing of the anterior supraspinatus involving approximately 30% of the footprint 7. Posterior rotator cuff attachment: normal, not engaging Hill-Sachs lesion present 8. Humeral head articular cartilage: Areas of grade 1 degenerative change 9. Rotator interval: Moderate synovitis 10: Subscapularis tendon: attachment intact 11. Anterior labrum: no labrum present from 3:00 - 5:30 o'clock positions; patulous anterior capsule 12. IGHL: stretched  OPERATIVE REPORT:   Indications for procedure: Jenna Wiley is a 70 y.o. year old  female who initially sustained a first-time right glenohumeral dislocation that was reduced in the emergency department on 05/30/2020 after she had a fall.  She is found to have a bony Bankart lesion that did not involve a significant portion of the anterior glenoid so she underwent nonoperative management and appropriate rehabilitation and was able to return to all activities as tolerated. However, the patient had another dislocation approximately 1 month ago while suddenly extending her arm to prevent a fall.  She dislocated her shoulder again without any force through the shoulder.  Since that time, she feels that she has appropriate motion in her shoulder, but has a difficult time trusting her shoulder to perform all her desired activities.  She has mild sensations of instability.  After discussion of risks, benefits, and alternatives to surgery, the patient elected to proceed.  Surgery was recommended for anterior and labral repair and capsulorraphy to reduce the risk of recurrent dislocation. There was minimal anterior glenoid bone loss on advanced imaging. Hill-sachs lesion was on track.   Procedure in detail: I identified Tamyah Cutbirth in the pre-operative holding area. The risks, benefits, complications, treatment options, and expected outcomes were discussed with the patient. The risks and potential complications of their problem and purposed treatment including but not limited to failure to fully relieve pain, infection, neurovascular compromise, complications from anesthesia, stiff shoulder, and failure of surgery with continued pain and/or recurrent dislocation. The patient concurred with the proposed plan, giving informed consent. I marked the operative shoulder with my initials.  Anesthesia was then performed with an interscalene block with Exparel.  The patient was transferred to the operative suite and placed in the beach chair position.    Appropriate IV antibiotics were administered  prior to incision. The operative upper extremity was then  prepped and draped in standard fashion. A time out was performed confirming the correct extremity, correct patient, and correct procedure.   I then created a standard posterior portal with an 11 blade. The glenohumeral joint was easily entered with a blunt trochar and the arthroscope introduced. A low anterior and medial portal was made using spinal needle localization and entering just above the subscapularis.  A 7 mm cannula was placed. The findings of diagnostic arthroscopy are described above.  I then coagulated the inflamed synovium to obtain hemostasis and reduce the risk of post-operative swelling using an Arthrocare radiofrequency device.  I then debrided the undersurface of the anterior supraspinatus with an oscillating shaver in the region of partial-thickness tearing.  Next, the arthroscope was placed into the subacromial space.  An anterior superolateral portal was made.  An oscillating shaver was used to debride the extensive subacromial bursa.  The rotator cuff was examined from the bursal side as well.  Partial-thickness tearing was confirmed as there was no full-thickness tear extending to the bursal surface.  The arthroscope was returned to the glenohumeral joint.   Next, a switching stick was placed through the anterolateral portal into the glenohumeral joint just posterior to the biceps.  Another cannula was placed.  This portal served as an accessory portal for suture management.  The arthroscope was then placed in this anterolateral portal and the posterior labrum was debrided with an oscillating shaver from the posterior portal.  Next, arthroscope was returned to the posterior portal.  Elevator was used to elevate the capsule off the glenoid.  There was no notable labral tissue from 3:30 to 5:30 on the glenoid clock face.  A rasp was used to roughen the glenoid for improvement of healing. A shaver was also used to debride further  clean the glenoid face to allow for improved healing.  An Arthrex Knotless FiberTak was drilled and placed using the curved drill guide at the 5:30 position.  The Arthrex SutureLasso SD was then used to pass the nitinol wire loop through the capsule and under the labrum.  Sutures were shuttled appropriately and the passing stitch was shuttled through the anchor.  It was tensioned appropriately and the capsule was appropriately reapproximated to create an anterior inferior bumper.  Suture was cut with arthroscopic cutter.  This process was repeated for a 4:30 and 3:30 anchor.  This construct tightened the labrum and capsule onto the glenoid as a bumper and tightened as well as shifted the capsule from inferior to superior. The repair was stable to probing and there was an excellent anterior bumper.   Fluid was evacuated from the shoulder, and the portals were closed with 3-0 Nylon. Xeroform was applied to the portals. A sterile dressing was applied, followed by a Polar Care sleeve and a SlingShot shoulder immobilizer/sling. The patient was awakened from anesthesia without difficulty and was transferred to the PACU in stable condition.   Of note, assistance from a PA was essential to performing the surgery.  PA was present for the entire surgery.  PA assisted with patient positioning, retraction, instrumentation, and wound closure. The surgery would have been more difficult and had longer operative time without PA assistance.      COMPLICATIONS: none  DISPOSITION: plan for discharge home after recovery in PACU  POSTOPERATIVE PLAN: Remain in sling (except hygiene and elbow/wrist/hand RoM exercises as instructed by PT) x 4 weeks and NWB for this time. PT to begin 3-4 days after surgery. Anterior shoulder stabilization/capsulorraphy protocol.

## 2020-11-14 NOTE — Transfer of Care (Addendum)
Immediate Anesthesia Transfer of Care Note  Patient: Jenna Wiley  Procedure(s) Performed: Right shoulder arthroscopic Bankart repair, possible biceps tenodesis - Reche Dixon to Assist (Right: Shoulder)  Patient Location: PACU  Anesthesia Type: General, Regional  Level of Consciousness: awake, alert  and patient cooperative  Airway and Oxygen Therapy: Patient Spontanous Breathing and Patient connected to supplemental oxygen  Post-op Assessment: Post-op Vital signs reviewed, Patient's Cardiovascular Status Stable, Respiratory Function Stable, Patent Airway and No signs of Nausea or vomiting  Post-op Vital Signs: Reviewed and stable  Complications: No notable events documented.

## 2020-11-14 NOTE — Anesthesia Preprocedure Evaluation (Signed)
Anesthesia Evaluation  Patient identified by MRN, date of birth, ID band Patient awake    Reviewed: Allergy & Precautions, NPO status   Airway Mallampati: II  TM Distance: >3 FB     Dental   Pulmonary    Pulmonary exam normal        Cardiovascular hypertension,  Rhythm:Regular Rate:Normal  HLD   Neuro/Psych    GI/Hepatic   Endo/Other    Renal/GU      Musculoskeletal Osteopenia   Abdominal   Peds  Hematology   Anesthesia Other Findings Hx AML 2020  Reproductive/Obstetrics                             Anesthesia Physical Anesthesia Plan  ASA: 2  Anesthesia Plan: General and Regional   Post-op Pain Management:  Regional for Post-op pain   Induction: Intravenous  PONV Risk Score and Plan: Treatment may vary due to age or medical condition  Airway Management Planned: LMA  Additional Equipment:   Intra-op Plan:   Post-operative Plan:   Informed Consent: I have reviewed the patients History and Physical, chart, labs and discussed the procedure including the risks, benefits and alternatives for the proposed anesthesia with the patient or authorized representative who has indicated his/her understanding and acceptance.     Dental advisory given  Plan Discussed with: CRNA  Anesthesia Plan Comments:         Anesthesia Quick Evaluation

## 2020-11-14 NOTE — Anesthesia Procedure Notes (Signed)
Anesthesia Regional Block: Interscalene brachial plexus block   Pre-Anesthetic Checklist: , timeout performed,  Correct Patient, Correct Site, Correct Laterality,  Correct Procedure, Correct Position, site marked,  Risks and benefits discussed,  Surgical consent,  Pre-op evaluation,  At surgeon's request and post-op pain management  Laterality: Right  Prep: chloraprep       Needles:  Injection technique: Single-shot  Needle Type: Stimiplex     Needle Length: 10cm  Needle Gauge: 21     Additional Needles:   Procedures:,,,, ultrasound used (permanent image in chart),,    Narrative:  Start time: 11/14/2020 7:00 AM End time: 11/14/2020 7:07 AM Injection made incrementally with aspirations every 5 mL.  Performed by: Personally  Anesthesiologist: Veda Canning, MD  Additional Notes: Functioning IV was confirmed and monitors applied. Ultrasound guidance: relevant anatomy identified, needle position confirmed, local anesthetic spread visualized around nerve(s)., vascular puncture avoided.  Image printed for medical record.  Negative aspiration and no paresthesias; incremental administration of local anesthetic. The patient tolerated the procedure well. Vitals signs recorded in RN notes.

## 2020-11-14 NOTE — H&P (Signed)
Paper H&P to be scanned into permanent record. H&P reviewed. No significant changes noted.  

## 2020-11-14 NOTE — Progress Notes (Signed)
Assisted Veda Canning, ANMD  with right, ultrasound guided, interscalene  block. Side rails up, monitors on throughout procedure. See vital signs in flow sheet. Tolerated Procedure well.

## 2020-11-14 NOTE — Discharge Instructions (Addendum)
Post-Op Instructions  1. Bracing: You will wear a shoulder immobilizer or sling for at least 4 weeks. Total time will be determined by type of surgical repair and can be discussed at 1st postop appointment.  2. Driving: No driving for 4 weeks post-op. When driving, do not wear the immobilizer.  3. Activity: No active lifting for 2 months. Wrist, hand, and elbow motion only. Avoid lifting the upper arm away from the body except for hygiene. You are permitted to bend and straighten the elbow actively. You may use your hand and wrist for typing, writing, and managing utensils (cutting food). Do not lift more than a coffee cup for 8 weeks.  When sleeping or resting, inclined positions (recliner chair or wedge pillow) and a pillow under the forearm for support may provide better comfort for up to 4 weeks.  Avoid long distance travel for 4 weeks.  Return to normal activities after labral repair normally takes 6 months on average. If rehab goes very well, may be able to do most activities at 4 months, except overhead or contact sports.  4. Physical Therapy: Begins 3-4 days after surgery, and proceeds 2 times per week for the first 4 weeks, then 1-2 times per week from weeks 4-8 post-op.  5. Medications:  - You will be provided a prescription for narcotic pain medicine. After surgery, take 1-2 narcotic tablets every 4 hours if needed for severe pain.  - A prescription for anti-nausea medication will be provided in case the narcotic medicine causes nausea - take 1 tablet every 6 hours only if nauseated.   - Take tylenol 1000 mg every 8 hours for pain.  May stop tylenol 3 days after surgery if you are having minimal pain.  If you are taking prescription medication for anxiety, depression, insomnia, muscle spasm, chronic pain, or for attention deficit disorder, you are advised that you are at a higher risk of adverse effects with use of narcotics post-op, including narcotic addiction/dependence, depressed  breathing, death. If you use non-prescribed substances: alcohol, marijuana, cocaine, heroin, methamphetamines, etc., you are at a higher risk of adverse effects with use of narcotics post-op, including narcotic addiction/dependence, depressed breathing, death. You are advised that taking > 50 morphine milligram equivalents (MME) of narcotic pain medication per day results in twice the risk of overdose or death. For your prescription provided: oxycodone 5 mg - taking more than 6 tablets per day would result in > 50 morphine milligram equivalents (MME) of narcotic pain medication. Be advised that we will prescribe narcotics short-term, for acute post-operative pain only - 3 weeks for major operations such as shoulder repair/reconstruction surgeries.   6. Post-Op Appointment:  Your first post-op appointment will be 10-14 days post-op.  7. Work or School: For most, but not all procedures, we advise staying out of work or school for at least 1 to 2 weeks in order to recover from the stress of surgery and to allow time for healing.   If you need a work or school note this can be provided.   Post-operative Brace: Apply and remove the brace you received as you were instructed to at the time of fitting and as described in detail as the brace's instructions for use indicate.  Wear the brace for the period of time prescribed by your physician.  The brace can be cleaned with soap and water and allowed to air dry only.  Should the brace result in increased pain, decreased feeling (numbness/tingling), increased swelling or an overall worsening  of your medical condition, please contact your doctor immediately.  If an emergency situation occurs as a result of wearing the brace after normal business hours, please dial 911 and seek immediate medical attention.  Let your doctor know if you have any further questions about the brace issued to you. Refer to the shoulder sling instructions for use if you have any questions  regarding the correct fit of your shoulder sling.  [facility]bles[facility]ooting: [phone]  Video that illustrates how to properly use a shoulder sling: "Instructions for Proper Use of an Orthopaedic Sling" [email or web address]              Information for Discharge Teaching: EXPAREL (bupivacaine liposome injectable suspension)   Your surgeon or anesthesiologist gave you EXPAREL(bupivacaine) to help control your pain after surgery.  EXPAREL is a local anesthetic that provides pain relief by numbing the tissue around the surgical site. EXPAREL is designed to release pain medication over time and can control pain for up to 72 hours. Depending on how you respond to EXPAREL, you may require less pain medication during your recovery.  Possible side effects: Temporary loss of sensation or ability to move in the area where bupivacaine was injected. Nausea, vomiting, constipation Rarely, numbness and tingling in your mouth or lips, lightheadedness, or anxiety may occur. Call your doctor right away if you think you may be experiencing any of these sensations, or if you have other questions regarding possible side effects.  Follow all other discharge instructions given to you by your surgeon or nurse. Eat a healthy diet and drink plenty of water or other fluids.  If you return to the hospital for any reason within 96 hours following the administration of EXPAREL, it is important for health care providers to know that you have received this anesthetic. A teal colored band has been placed on your arm with the date, time and amount of EXPAREL you have received in order to alert and inform your health care providers. Please leave this armband in place for the full 96 hours following administration, and then you may remove the band.   PERIPHERAL NERVE BLOCK PATIENT INFORMATION  Your surgeon has requested a peripheral nerve block for your surgery. This  anesthetic technique provides excellent post-operative pain relief for you in a safe and effective manner. It will also help reduce the risk of nausea and vomiting and allow earlier discharge from the hospital.   The block is performed under sedation with ultrasound guidance prior to your procedure. Due to the sedation, your may or may not remember the block experience. The nerve block will begin to take effect anywhere from 5 to 30 minutes after being administered. You will be transported to the operating room from your surgery after the block is completed.   At the end of surgery, when the anesthesia wears off, you will notice a few things. Your may not be able to move or feel the part of your body targeted by the nerve block. These are normal experiences, and they will disappear as the block wears off.  If you had an interscalene nerve block performed (which is common for shoulder surgery), your voice can be very hoarse and you may feel that you are not able to take as deep a breath as you did before surgery. Some patients may also notice a droopy eyelid on the affected side. These symptoms will resolve once the block wears off.  Pain control: The nerve block technique used is a single  injection that can last anywhere from 1-3 days. The duration of the numbness can vary between individuals. After leaving the hospital, it is important that you begin to take your prescribed pain medication when you start to sense the nerve block wearing off. This will help you avoid unpleasant pain at the time the nerve block wears off, which can sometimes be in the middle of the night. The block will only cover pain in the areas targeted by the nerve block so if you experience surgical pain outside of that area, please take your prescribed pain medication. Management of the "numb area": After a nerve block, you cannot feel pain, pressure, or temperature in the affected area so there is an increased risk for injury. You  should take extra care to protect the affected areas until sensation and movement returns. Please take caution to not come in contact with extremely hot or cold items because you will not be able to sense or protect yourself form the extremes of temperature.  You may experience some persistent numbness after the procedure by most neurological deficits resolve over time and the incidence of serious long term neurological complications attributable to peripheral nerve blocks are relatively uncommon.

## 2020-11-14 NOTE — Anesthesia Postprocedure Evaluation (Signed)
Anesthesia Post Note  Patient: Jenna Wiley  Procedure(s) Performed: Right shoulder arthroscopic  Bankart repair- Reche Dixon to Assist (Right: Shoulder)     Patient location during evaluation: PACU Anesthesia Type: Regional Level of consciousness: awake Pain management: pain level controlled Vital Signs Assessment: post-procedure vital signs reviewed and stable Respiratory status: respiratory function stable Cardiovascular status: stable Postop Assessment: no signs of nausea or vomiting Anesthetic complications: no   No notable events documented.  Veda Canning

## 2020-11-14 NOTE — Anesthesia Procedure Notes (Signed)
Procedure Name: LMA Insertion Date/Time: 11/14/2020 7:40 AM Performed by: Mayme Genta, CRNA Pre-anesthesia Checklist: Patient identified, Emergency Drugs available, Suction available, Timeout performed and Patient being monitored Patient Re-evaluated:Patient Re-evaluated prior to induction Oxygen Delivery Method: Circle system utilized Preoxygenation: Pre-oxygenation with 100% oxygen Induction Type: IV induction LMA: LMA inserted LMA Size: 4.0 Number of attempts: 1 Placement Confirmation: positive ETCO2 and breath sounds checked- equal and bilateral Tube secured with: Tape

## 2020-11-17 ENCOUNTER — Encounter: Payer: Self-pay | Admitting: Orthopedic Surgery

## 2021-01-05 ENCOUNTER — Inpatient Hospital Stay: Payer: Medicare HMO | Attending: Oncology

## 2021-01-05 DIAGNOSIS — C9201 Acute myeloblastic leukemia, in remission: Secondary | ICD-10-CM | POA: Diagnosis present

## 2021-01-05 DIAGNOSIS — D696 Thrombocytopenia, unspecified: Secondary | ICD-10-CM | POA: Diagnosis not present

## 2021-01-05 LAB — PATHOLOGIST SMEAR REVIEW

## 2021-01-05 LAB — CBC WITH DIFFERENTIAL/PLATELET
Abs Immature Granulocytes: 0.01 10*3/uL (ref 0.00–0.07)
Basophils Absolute: 0 10*3/uL (ref 0.0–0.1)
Basophils Relative: 0 %
Eosinophils Absolute: 0.1 10*3/uL (ref 0.0–0.5)
Eosinophils Relative: 3 %
HCT: 33.8 % — ABNORMAL LOW (ref 36.0–46.0)
Hemoglobin: 11.5 g/dL — ABNORMAL LOW (ref 12.0–15.0)
Immature Granulocytes: 0 %
Lymphocytes Relative: 16 %
Lymphs Abs: 0.6 10*3/uL — ABNORMAL LOW (ref 0.7–4.0)
MCH: 33.6 pg (ref 26.0–34.0)
MCHC: 34 g/dL (ref 30.0–36.0)
MCV: 98.8 fL (ref 80.0–100.0)
Monocytes Absolute: 0.5 10*3/uL (ref 0.1–1.0)
Monocytes Relative: 13 %
Neutro Abs: 2.7 10*3/uL (ref 1.7–7.7)
Neutrophils Relative %: 68 %
Platelets: 115 10*3/uL — ABNORMAL LOW (ref 150–400)
RBC: 3.42 MIL/uL — ABNORMAL LOW (ref 3.87–5.11)
RDW: 12.3 % (ref 11.5–15.5)
WBC: 4 10*3/uL (ref 4.0–10.5)
nRBC: 0 % (ref 0.0–0.2)

## 2021-01-05 LAB — COMPREHENSIVE METABOLIC PANEL
ALT: 13 U/L (ref 0–44)
AST: 19 U/L (ref 15–41)
Albumin: 4.3 g/dL (ref 3.5–5.0)
Alkaline Phosphatase: 38 U/L (ref 38–126)
Anion gap: 8 (ref 5–15)
BUN: 28 mg/dL — ABNORMAL HIGH (ref 8–23)
CO2: 24 mmol/L (ref 22–32)
Calcium: 8.9 mg/dL (ref 8.9–10.3)
Chloride: 104 mmol/L (ref 98–111)
Creatinine, Ser: 0.96 mg/dL (ref 0.44–1.00)
GFR, Estimated: >60 mL/min (ref >60)
Glucose, Bld: 110 mg/dL — ABNORMAL HIGH (ref 70–99)
Potassium: 4.4 mmol/L (ref 3.5–5.1)
Sodium: 136 mmol/L (ref 135–145)
Total Bilirubin: 0.4 mg/dL (ref 0.3–1.2)
Total Protein: 7 g/dL (ref 6.5–8.1)

## 2021-01-05 LAB — LACTATE DEHYDROGENASE: LDH: 146 U/L (ref 98–192)

## 2021-01-07 ENCOUNTER — Inpatient Hospital Stay (HOSPITAL_BASED_OUTPATIENT_CLINIC_OR_DEPARTMENT_OTHER): Payer: Medicare HMO | Admitting: Oncology

## 2021-01-07 ENCOUNTER — Encounter: Payer: Self-pay | Admitting: Oncology

## 2021-01-07 VITALS — BP 133/84 | HR 77 | Temp 98.5°F | Resp 17

## 2021-01-07 DIAGNOSIS — D72819 Decreased white blood cell count, unspecified: Secondary | ICD-10-CM | POA: Diagnosis not present

## 2021-01-07 DIAGNOSIS — C9201 Acute myeloblastic leukemia, in remission: Secondary | ICD-10-CM | POA: Diagnosis not present

## 2021-01-07 DIAGNOSIS — D696 Thrombocytopenia, unspecified: Secondary | ICD-10-CM | POA: Diagnosis not present

## 2021-01-07 LAB — COMP PANEL: LEUKEMIA/LYMPHOMA

## 2021-01-07 NOTE — Progress Notes (Signed)
Hematology/Oncology follow-up note [facility] [phone] [phone]   Patient Care Team: Kirk Ruths, MD as PCP - General (Internal Medicine)  REFERRING PROVIDER: Kirk Ruths, MD  CHIEF COMPLAINTS/REASON FOR VISIT:  Follow up for AML  HISTORY OF PRESENTING ILLNESS:   Jenna Wiley is a  70 y.o.  female with PMH listed below was seen in consultation at the request of  Kirk Ruths, MD  for follow up of AML.   I obtained patient's past medical history and extensive medical record review was performed.  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  Patient received leukemia chemotherapy at [facility] 08/2018 -09/05/2018 7+3 regimen. Day 14 marrow showed blasts <5%,  09/08/2018 bone marrow biopsy reviewed hypocellular marrow with 5-7% blasts, consistent with marrow recovery.  Office notes indicate that MRD testing was negative on the specimen.  NGS was also negative for WT1. 09/2018-10/24/2018 HIDAC x 2 cycles 11/02/2018 - 11/16/2018 Febrile neutropenia, strep viridans as well as aortic valve endocarditis with AV vegetation. She finished extended course of IV antibitotics.   12/12/2018 Bone marrow biopsy 10-20% cellularity, CR.  MRD by flow cytometry was negative. [facility] decision was made to forego further consolidation treatments. With plan for series CBC monitoring.   She moved to and followed up with Dr.Millenson [place] and have CBC monitored periodically.   05/2019 COVID 19 infection, managed with supportive care at home and symptoms resolved within 2 weeks.   10/15/2019 seen by Dr.Millenson. She was recommended for COVID vaccination and deferred at that time.  Blood work showed Wbc 3.8, Hb 11.2, hct 32.9, MCV 101, platelet 78000, neutrophil 76%, lymphocyte 11.7% ANC 2.9  Patient has moved to [facility]  since then.  Today she reports feeling well.  No fever, chills, night sweats, unintentional weight loss.  Appetite is good.  She has establish care with primary care provider Dr. Ouida Sills and was seen on 03/05/2020.  Patient was referred to establish care with hematology for surveillance.   INTERVAL HISTORY Jenna Wiley is a 70 y.o. female who has above history reviewed by me today presents for follow up visit for management of history of AML, chronic thrombocytopenia Problems and complaints are listed below: Patient recently suffered right shoulder dislocation and pleural cuff tear. She underwent right shoulder arthroscopic anterior labral repair and capsulorraphy. Denies weight loss, fever, chills, fatigue, night sweats.   . Review of Systems  Constitutional:  Negative for appetite change, chills, fatigue and fever.  HENT:   Negative for hearing loss and voice change.   Eyes:  Negative for eye problems.  Respiratory:  Negative for chest tightness and cough.   Cardiovascular:  Negative for chest pain.  Gastrointestinal:  Negative for abdominal distention, abdominal pain and blood in stool.  Endocrine: Negative for hot flashes.  Genitourinary:  Negative for difficulty urinating and frequency.   Musculoskeletal:  Negative for arthralgias.  Skin:  Negative for itching and rash.  Neurological:  Negative for extremity weakness.  Hematological:  Negative for adenopathy.  Psychiatric/Behavioral:  Negative for confusion.    MEDICAL HISTORY:  Past Medical History:  Diagnosis Date   Cancer ([place]) 07/09/2018   AML leukemia   Hypertension    Osteopenia     SURGICAL HISTORY: Past Surgical History:  Procedure Laterality Date   AUGMENTATION MAMMAPLASTY Bilateral    2006; replaced 2017   BREAST IMPLANT REMOVAL  2017   CESAREAN  SECTION  1983   PERCUTANEOUS FIXATION TIBIAL SHAFT FRACTURE W/ PINS / SCREWS  2018   pin in thumb   PLACEMENT OF BREAST IMPLANTS  2006   SHOULDER ARTHROSCOPY  WITH BANKART REPAIR Right 11/14/2020   Procedure: Right shoulder arthroscopic  Bankart repair- Reche Dixon to Assist;  Surgeon: Leim Fabry, MD;  Location: [facility];  Service: Orthopedics;  Laterality: Right;    SOCIAL HISTORY: Social History   Socioeconomic History   Marital status: Married    Spouse name: Not on file   Number of children: Not on file   Years of education: Not on file   Highest education level: Not on file  Occupational History   Not on file  Tobacco Use   Smoking status: Never   Smokeless tobacco: Never  Vaping Use   Vaping Use: Never used  Substance and Sexual Activity   Alcohol use: Yes    Alcohol/week: 2.0 standard drinks    Types: 2 Cans of beer per week    Comment: social drinker   Drug use: Not Currently   Sexual activity: Yes  Other Topics Concern   Not on file  Social History Narrative   Not on file   Social Determinants of Health   Financial Resource Strain: Not on file  Food Insecurity: Not on file  Transportation Needs: Not on file  Physical Activity: Not on file  Stress: Not on file  Social Connections: Not on file  Intimate Partner Violence: Not on file    FAMILY HISTORY: Family History  Problem Relation Age of Onset   Brain cancer Mother    Liver cancer Father    Hypertension Father    Breast cancer Neg Hx     ALLERGIES:  is allergic to nitrofurantoin.  MEDICATIONS:  Current Outpatient Medications  Medication Sig Dispense Refill   acetaminophen (TYLENOL) 500 MG tablet Take 2 tablets (1,000 mg total) by mouth every 8 (eight) hours. 90 tablet 2   amLODipine (NORVASC) 5 MG tablet Take 1 tablet by mouth daily.     atorvastatin (LIPITOR) 10 MG tablet Take 10 mg by mouth daily.     calcium citrate-vitamin D (CITRACAL+D) 315-200 MG-UNIT tablet Take by mouth.     Cholecalciferol 50 MCG (2000 UT) TABS Take by mouth.     COLLAGEN PO Take by mouth.     diphenhydrAMINE (BENADRYL) 25 mg capsule Take 25 mg by mouth every 6  (six) hours as needed.     ELDERBERRY PO Take by mouth.     MAGNESIUM PO Take 1 tablet by mouth daily.     Melatonin 10 MG TABS Take by mouth.     Multiple Vitamin (MULTIVITAMIN ADULT PO) Take by mouth.     Multiple Vitamins-Minerals (DESICCATED LIVER/B-12 PO) Take by mouth.     Omega-3 Fatty Acids (FISH OIL PO) Take 1 capsule by mouth daily.     ondansetron (ZOFRAN ODT) 4 MG disintegrating tablet Take 1 tablet (4 mg total) by mouth every 8 (eight) hours as needed for nausea or vomiting. 20 tablet 0   oxyCODONE (ROXICODONE) 5 MG immediate release tablet Take 1-2 tablets (5-10 mg total) by mouth every 4 (four) hours as needed (pain). 15 tablet 0   Probiotic Product (PROBIOTIC DAILY PO) Take by mouth daily.     TURMERIC PO Take by mouth.     vitamin B-12 (CYANOCOBALAMIN) 1000 MCG tablet Take 1,000 mcg by mouth daily.     No current facility-administered medications for this visit.  PHYSICAL EXAMINATION: ECOG PERFORMANCE STATUS: 0 - Asymptomatic Vitals:   01/07/21 1315  BP: 133/84  Pulse: 77  Resp: 17  Temp: 98.5 F (36.9 C)   There were no vitals filed for this visit.   Physical Exam Constitutional:      General: She is not in acute distress. HENT:     Head: Normocephalic and atraumatic.  Eyes:     General: No scleral icterus. Cardiovascular:     Rate and Rhythm: Normal rate and regular rhythm.     Heart sounds: Normal heart sounds.  Pulmonary:     Effort: Pulmonary effort is normal. No respiratory distress.     Breath sounds: No wheezing.  Abdominal:     General: Bowel sounds are normal. There is no distension.     Palpations: Abdomen is soft.  Musculoskeletal:        General: No deformity. Normal range of motion.     Cervical back: Normal range of motion and neck supple.  Skin:    General: Skin is warm and dry.     Findings: No erythema or rash.  Neurological:     Mental Status: She is alert and oriented to person, place, and time. Mental status is at baseline.      Cranial Nerves: No cranial nerve deficit.     Coordination: Coordination normal.  Psychiatric:        Mood and Affect: Mood normal.    LABORATORY DATA:  I have reviewed the data as listed Lab Results  Component Value Date   WBC 4.0 01/05/2021   HGB 11.5 (L) 01/05/2021   HCT 33.8 (L) 01/05/2021   MCV 98.8 01/05/2021   PLT 115 (L) 01/05/2021   Recent Labs    07/02/20 1527 10/08/20 1258 01/05/21 1327  NA 139 140 136  K 4.1 4.3 4.4  CL 106 106 104  CO2 _0 GLUCOSE 105* 107* 110*  BUN 24* 25* 28*  CREATININE 1.02* 1.05* 0.96  CALCIUM 9.0 9.2 8.9  GFRNONAA >60 58* >60  PROT 7.3 6.8 7.0  ALBUMIN 4.5 4.3 4.3  AST _1 ALT _2 ALKPHOS 38 34* 38  BILITOT 0.5 0.6 0.4    Iron/TIBC/Ferritin/ %Sat No results found for: IRON, TIBC, FERRITIN, IRONPCTSAT    RADIOGRAPHIC STUDIES: I have personally reviewed the radiological images as listed and agreed with the findings in the report. No results found.    ASSESSMENT & PLAN:  1. AML (acute myeloid leukemia) in remission ([place])   2. Thrombocytopenia ([facility])   3. Leukopenia, unspecified type    #History of AML, Clinically she is doing very well. Labs reviewed and discussed with patient in details. Mild leukopenia has resolved.  This could be reactive due to recent right shoulder surgery. Thrombocytopenia, platelet count is stable at 1 15,000. Peripheral blood flow cytometry is pending. Pathology smear showed normal morphology and no evidence of circulating blasts. Bone marrow biopsy as clinically indicated.  Recommend patient to follow-up in 6 months. . Orders Placed This Encounter  Procedures   CBC with Differential/Platelet    Standing Status:   Future    Standing Expiration Date:   01/07/2022   Comprehensive metabolic panel    Standing Status:   Future    Standing Expiration Date:   01/07/2022   Lactate dehydrogenase    Standing Status:   Future    Standing Expiration Date:   01/07/2022   Flow  cytometry panel-leukemia/lymphoma work-up  Standing Status:   Future    Standing Expiration Date:   01/07/2022   Pathologist smear review    Standing Status:   Future    Standing Expiration Date:   01/07/2022    All questions were answered. The patient knows to call the clinic with any problems questions or concerns.  cc Kirk Ruths, MD    Return of visit: 6 months.  Lab MD   Earlie Server, MD, PhD Hematology [facility] at [facility] Pager- [phone] 01/07/2021

## 2021-01-07 NOTE — Progress Notes (Signed)
No New concerns today

## 2021-03-11 ENCOUNTER — Other Ambulatory Visit: Payer: Self-pay | Admitting: Internal Medicine

## 2021-03-11 DIAGNOSIS — Z1231 Encounter for screening mammogram for malignant neoplasm of breast: Secondary | ICD-10-CM

## 2021-05-20 ENCOUNTER — Ambulatory Visit
Admission: RE | Admit: 2021-05-20 | Discharge: 2021-05-20 | Disposition: A | Discharge status: Home / Self Care | Payer: Medicare HMO | Source: Ambulatory Visit | Attending: Internal Medicine | Admitting: Internal Medicine

## 2021-05-20 ENCOUNTER — Other Ambulatory Visit: Payer: Self-pay

## 2021-05-20 DIAGNOSIS — Z1231 Encounter for screening mammogram for malignant neoplasm of breast: Secondary | ICD-10-CM | POA: Insufficient documentation

## 2021-05-20 HISTORY — DX: Personal history of antineoplastic chemotherapy: Z92.21

## 2021-05-25 ENCOUNTER — Telehealth: Payer: Self-pay | Admitting: Oncology

## 2021-05-25 NOTE — Telephone Encounter (Signed)
Pt called to reschedule her appt for 3-1. Call back at [phone]

## 2021-07-01 ENCOUNTER — Inpatient Hospital Stay: Payer: Medicare HMO | Attending: Oncology

## 2021-07-01 ENCOUNTER — Other Ambulatory Visit: Payer: Self-pay

## 2021-07-01 DIAGNOSIS — C9201 Acute myeloblastic leukemia, in remission: Secondary | ICD-10-CM | POA: Diagnosis present

## 2021-07-01 LAB — CBC WITH DIFFERENTIAL/PLATELET
Abs Immature Granulocytes: 0.03 10*3/uL (ref 0.00–0.07)
Basophils Absolute: 0 10*3/uL (ref 0.0–0.1)
Basophils Relative: 0 %
Eosinophils Absolute: 0.1 10*3/uL (ref 0.0–0.5)
Eosinophils Relative: 3 %
HCT: 36.4 % (ref 36.0–46.0)
Hemoglobin: 12.2 g/dL (ref 12.0–15.0)
Immature Granulocytes: 1 %
Lymphocytes Relative: 20 %
Lymphs Abs: 0.6 10*3/uL — ABNORMAL LOW (ref 0.7–4.0)
MCH: 32.9 pg (ref 26.0–34.0)
MCHC: 33.5 g/dL (ref 30.0–36.0)
MCV: 98.1 fL (ref 80.0–100.0)
Monocytes Absolute: 0.4 10*3/uL (ref 0.1–1.0)
Monocytes Relative: 13 %
Neutro Abs: 1.7 10*3/uL (ref 1.7–7.7)
Neutrophils Relative %: 63 %
Platelets: 129 10*3/uL — ABNORMAL LOW (ref 150–400)
RBC: 3.71 MIL/uL — ABNORMAL LOW (ref 3.87–5.11)
RDW: 12.6 % (ref 11.5–15.5)
WBC: 2.8 10*3/uL — ABNORMAL LOW (ref 4.0–10.5)
nRBC: 0 % (ref 0.0–0.2)

## 2021-07-01 LAB — COMPREHENSIVE METABOLIC PANEL
ALT: 16 U/L (ref 0–44)
AST: 18 U/L (ref 15–41)
Albumin: 4.3 g/dL (ref 3.5–5.0)
Alkaline Phosphatase: 37 U/L — ABNORMAL LOW (ref 38–126)
Anion gap: 6 (ref 5–15)
BUN: 23 mg/dL (ref 8–23)
CO2: 27 mmol/L (ref 22–32)
Calcium: 9.1 mg/dL (ref 8.9–10.3)
Chloride: 105 mmol/L (ref 98–111)
Creatinine, Ser: 0.98 mg/dL (ref 0.44–1.00)
GFR, Estimated: >60 mL/min (ref >60)
Glucose, Bld: 91 mg/dL (ref 70–99)
Potassium: 4.3 mmol/L (ref 3.5–5.1)
Sodium: 138 mmol/L (ref 135–145)
Total Bilirubin: 0.8 mg/dL (ref 0.3–1.2)
Total Protein: 7.2 g/dL (ref 6.5–8.1)

## 2021-07-01 LAB — LACTATE DEHYDROGENASE: LDH: 138 U/L (ref 98–192)

## 2021-07-01 LAB — PATHOLOGIST SMEAR REVIEW

## 2021-07-03 LAB — COMP PANEL: LEUKEMIA/LYMPHOMA

## 2021-07-08 ENCOUNTER — Other Ambulatory Visit: Payer: Self-pay

## 2021-07-08 ENCOUNTER — Encounter: Payer: Self-pay | Admitting: Oncology

## 2021-07-08 ENCOUNTER — Other Ambulatory Visit: Payer: Medicare HMO

## 2021-07-08 ENCOUNTER — Inpatient Hospital Stay: Payer: Medicare HMO | Attending: Oncology | Admitting: Oncology

## 2021-07-08 VITALS — BP 122/74 | HR 65 | Temp 97.7°F | Resp 18 | Wt 167.6 lb

## 2021-07-08 DIAGNOSIS — Z806 Family history of leukemia: Secondary | ICD-10-CM | POA: Diagnosis not present

## 2021-07-08 DIAGNOSIS — Z79899 Other long term (current) drug therapy: Secondary | ICD-10-CM | POA: Insufficient documentation

## 2021-07-08 DIAGNOSIS — D696 Thrombocytopenia, unspecified: Secondary | ICD-10-CM | POA: Insufficient documentation

## 2021-07-08 DIAGNOSIS — D72819 Decreased white blood cell count, unspecified: Secondary | ICD-10-CM | POA: Diagnosis not present

## 2021-07-08 DIAGNOSIS — C9200 Acute myeloblastic leukemia, not having achieved remission: Secondary | ICD-10-CM | POA: Diagnosis not present

## 2021-07-08 NOTE — Progress Notes (Signed)
Hematology/Oncology Progress note [phone] [phone]      Patient Care Team: Kirk Ruths, MD as PCP - General (Internal Medicine)  REFERRING PROVIDER: Kirk Ruths, MD  CHIEF COMPLAINTS/REASON FOR VISIT:  Follow up for AML  HISTORY OF PRESENTING ILLNESS:   Jenna Wiley is a  71 y.o.  female with PMH listed below was seen in consultation at the request of  Kirk Ruths, MD  for follow up of AML.   I obtained patient's past medical history and extensive medical record review was performed.  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  Patient received leukemia chemotherapy at [facility] 08/2018 -09/05/2018 7+3 regimen. Day 14 marrow showed blasts <5%,  09/08/2018 bone marrow biopsy reviewed hypocellular marrow with 5-7% blasts, consistent with marrow recovery.  Office notes indicate that MRD testing was negative on the specimen.  NGS was also negative for WT1. 09/2018-10/24/2018 HIDAC x 2 cycles 11/02/2018 - 11/16/2018 Febrile neutropenia, strep viridans as well as aortic valve endocarditis with AV vegetation. She finished extended course of IV antibitotics.   12/12/2018 Bone marrow biopsy 10-20% cellularity, CR.  MRD by flow cytometry was negative. Concesus decision was made to forego further consolidation treatments. With plan for series CBC monitoring.   She moved to and followed up with Dr.Millenson [place] and have CBC monitored periodically.   05/2019 COVID 19 infection, managed with supportive care at home and symptoms resolved within 2 weeks.   10/15/2019 seen by Dr.Millenson. She was recommended for COVID vaccination and deferred at that time.  Blood work showed Wbc 3.8, Hb 11.2, hct 32.9, MCV 101, platelet 78000, neutrophil 76%, lymphocyte 11.7% ANC 2.9  Patient has moved to [facility] since then.  Today she reports  feeling well.  No fever, chills, night sweats, unintentional weight loss.  Appetite is good.  She has establish care with primary care provider Dr. Ouida Sills and was seen on 03/05/2020.  Patient was referred to establish care with hematology for surveillance.   INTERVAL HISTORY Jenna Wiley is a 71 y.o. female who has above history reviewed by me today presents for follow up visit for management of history of AML, chronic thrombocytopenia Problems and complaints are listed below: Patient has no new complaints.  She has good appetite. Denies weight loss, fever, chills, fatigue, night sweats.   . Review of Systems  Constitutional:  Negative for appetite change, chills, fatigue and fever.  HENT:   Negative for hearing loss and voice change.   Eyes:  Negative for eye problems.  Respiratory:  Negative for chest tightness and cough.   Cardiovascular:  Negative for chest pain.  Gastrointestinal:  Negative for abdominal distention, abdominal pain and blood in stool.  Endocrine: Negative for hot flashes.  Genitourinary:  Negative for difficulty urinating and frequency.   Musculoskeletal:  Negative for arthralgias.  Skin:  Negative for itching and rash.  Neurological:  Negative for extremity weakness.  Hematological:  Negative for adenopathy.  Psychiatric/Behavioral:  Negative for confusion.    MEDICAL HISTORY:  Past Medical History:  Diagnosis Date   Cancer ([place]) 07/09/2018   AML leukemia   Hypertension    Osteopenia    Personal history of chemotherapy     SURGICAL HISTORY: Past Surgical History:  Procedure Laterality Date   AUGMENTATION MAMMAPLASTY Bilateral    2006; replaced 2017   BREAST IMPLANT REMOVAL  2017   [place]  PERCUTANEOUS FIXATION TIBIAL SHAFT FRACTURE W/ PINS / SCREWS  2018   pin in thumb   PLACEMENT OF BREAST IMPLANTS  2006   SHOULDER ARTHROSCOPY WITH BANKART REPAIR Right 11/14/2020   Procedure: Right shoulder arthroscopic  Bankart repair- Reche Dixon to Assist;  Surgeon: Leim Fabry, MD;  Location: [address];  Service: Orthopedics;  Laterality: Right;    SOCIAL HISTORY: Social History   Socioeconomic History   Marital status: Married    Spouse name: Not on file   Number of children: Not on file   Years of education: Not on file   Highest education level: Not on file  Occupational History   Not on file  Tobacco Use   Smoking status: Never   Smokeless tobacco: Never  Vaping Use   Vaping Use: Never used  Substance and Sexual Activity   Alcohol use: Yes    Alcohol/week: 2.0 standard drinks    Types: 2 Cans of beer per week    Comment: social drinker   Drug use: Not Currently   Sexual activity: Yes  Other Topics Concern   Not on file  Social History Narrative   Not on file   Social Determinants of Health   Financial Resource Strain: Not on file  Food Insecurity: Not on file  Transportation Needs: Not on file  Physical Activity: Not on file  Stress: Not on file  Social Connections: Not on file  Intimate Partner Violence: Not on file    FAMILY HISTORY: Family History  Problem Relation Age of Onset   Brain cancer Mother    Liver cancer Father    Hypertension Father    Breast cancer Neg Hx     ALLERGIES:  is allergic to nitrofurantoin.  MEDICATIONS:  Current Outpatient Medications  Medication Sig Dispense Refill   amLODipine (NORVASC) 5 MG tablet Take 1 tablet by mouth daily.     atorvastatin (LIPITOR) 10 MG tablet Take 10 mg by mouth daily.     calcium citrate-vitamin D (CITRACAL+D) 315-200 MG-UNIT tablet Take by mouth.     Cholecalciferol 50 MCG (2000 UT) TABS Take by mouth.     COLLAGEN PO Take by mouth.     diphenhydrAMINE (BENADRYL) 25 mg capsule Take 25 mg by mouth every 6 (six) hours as needed.     ELDERBERRY PO Take by mouth.     MAGNESIUM PO Take 1 tablet by mouth daily.     Melatonin 10 MG TABS Take by mouth.     Multiple Vitamin (MULTIVITAMIN ADULT PO) Take by mouth.     Omega-3  Fatty Acids (FISH OIL PO) Take 1 capsule by mouth daily.     Probiotic Product (PROBIOTIC DAILY PO) Take by mouth daily.     TURMERIC PO Take by mouth.     vitamin B-12 (CYANOCOBALAMIN) 1000 MCG tablet Take 1,000 mcg by mouth daily.     No current facility-administered medications for this visit.     PHYSICAL EXAMINATION: ECOG PERFORMANCE STATUS: 0 - Asymptomatic Vitals:   07/08/21 1014  BP: 122/74  Pulse: 65  Resp: 18  Temp: 97.7 F (36.5 C)   Filed Weights   07/08/21 1014  Weight: 167 lb 9.6 oz (76 kg)     Physical Exam Constitutional:      General: She is not in acute distress. HENT:     Head: Normocephalic and atraumatic.  Eyes:     General: No scleral icterus. Cardiovascular:     Rate and Rhythm: Normal rate  and regular rhythm.     Heart sounds: Normal heart sounds.  Pulmonary:     Effort: Pulmonary effort is normal. No respiratory distress.     Breath sounds: No wheezing.  Abdominal:     General: Bowel sounds are normal. There is no distension.     Palpations: Abdomen is soft.  Musculoskeletal:        General: No deformity. Normal range of motion.     Cervical back: Normal range of motion and neck supple.  Skin:    General: Skin is warm and dry.     Findings: No erythema or rash.  Neurological:     Mental Status: She is alert and oriented to person, place, and time. Mental status is at baseline.     Cranial Nerves: No cranial nerve deficit.     Coordination: Coordination normal.  Psychiatric:        Mood and Affect: Mood normal.    LABORATORY DATA:  I have reviewed the data as listed Lab Results  Component Value Date   WBC 2.8 (L) 07/01/2021   HGB 12.2 07/01/2021   HCT 36.4 07/01/2021   MCV 98.1 07/01/2021   PLT 129 (L) 07/01/2021   Recent Labs    10/08/20 1258 01/05/21 1327 07/01/21 0757  NA 140 136 138  K 4.3 4.4 4.3  CL 106 104 105  CO2 _0 GLUCOSE 107* 110* 91  BUN 25* 28* 23  CREATININE 1.05* 0.96 0.98  CALCIUM 9.2 8.9 9.1   GFRNONAA 58* >60 >60  PROT 6.8 7.0 7.2  ALBUMIN 4.3 4.3 4.3  AST _1 ALT _2 ALKPHOS 34* 38 37*  BILITOT 0.6 0.4 0.8    Iron/TIBC/Ferritin/ %Sat No results found for: IRON, TIBC, FERRITIN, IRONPCTSAT    RADIOGRAPHIC STUDIES: I have personally reviewed the radiological images as listed and agreed with the findings in the report. No results found.    ASSESSMENT & PLAN:  1. Family history of AML (acute myeloid leukemia)   2. Thrombocytopenia (HCC)   3. Leukopenia, unspecified type    #History of AML, Clinically she is doing very well. Labs reviewed and discussed with patient Mild leukopenia, Chronic, monitor.  #Thrombocytopenia, close to baseline. Peripheral blood flow cytometry is negative.  No constitutional symptoms. Smear showed no peripheral blast. Continue follow-up every 6 months.   Recommend patient to follow-up in 6 months. . Orders Placed This Encounter  Procedures   CBC with Differential/Platelet    Standing Status:   Future    Standing Expiration Date:   01/08/2022   Comprehensive metabolic panel    Standing Status:   Future    Standing Expiration Date:   01/08/2022   Lactate dehydrogenase    Standing Status:   Future    Standing Expiration Date:   01/08/2022   Pathologist smear review    Standing Status:   Future    Standing Expiration Date:   01/08/2022    All questions were answered. The patient knows to call the clinic with any problems questions or concerns.  cc Kirk Ruths, MD    Return of visit: 6 months.  Lab MD   Earlie Server, MD, PhD Hematology Oncology 07/08/2021

## 2021-07-15 ENCOUNTER — Ambulatory Visit: Payer: Medicare HMO | Admitting: Oncology

## 2021-11-25 ENCOUNTER — Other Ambulatory Visit: Payer: Self-pay | Admitting: Family Medicine

## 2021-11-25 DIAGNOSIS — R079 Chest pain, unspecified: Secondary | ICD-10-CM

## 2022-01-08 ENCOUNTER — Inpatient Hospital Stay: Payer: Medicare HMO | Admitting: Oncology

## 2022-01-08 ENCOUNTER — Inpatient Hospital Stay: Payer: Medicare HMO | Attending: Oncology

## 2022-04-08 ENCOUNTER — Other Ambulatory Visit: Payer: Self-pay | Admitting: Internal Medicine

## 2022-04-08 DIAGNOSIS — Z1231 Encounter for screening mammogram for malignant neoplasm of breast: Secondary | ICD-10-CM

## 2022-04-13 ENCOUNTER — Other Ambulatory Visit: Payer: Self-pay

## 2022-04-13 DIAGNOSIS — D696 Thrombocytopenia, unspecified: Secondary | ICD-10-CM

## 2022-04-14 ENCOUNTER — Inpatient Hospital Stay: Payer: Medicare HMO | Admitting: Oncology

## 2022-04-14 ENCOUNTER — Other Ambulatory Visit: Payer: Self-pay

## 2022-04-14 ENCOUNTER — Encounter: Payer: Self-pay | Admitting: Oncology

## 2022-04-14 ENCOUNTER — Inpatient Hospital Stay: Payer: Medicare HMO | Attending: Oncology

## 2022-04-14 VITALS — BP 148/84 | HR 61 | Temp 97.9°F | Resp 18 | Wt 170.8 lb

## 2022-04-14 DIAGNOSIS — C9201 Acute myeloblastic leukemia, in remission: Secondary | ICD-10-CM

## 2022-04-14 DIAGNOSIS — D696 Thrombocytopenia, unspecified: Secondary | ICD-10-CM | POA: Diagnosis not present

## 2022-04-14 DIAGNOSIS — Z79899 Other long term (current) drug therapy: Secondary | ICD-10-CM | POA: Diagnosis not present

## 2022-04-14 LAB — TECHNOLOGIST SMEAR REVIEW
Plt Morphology: NORMAL
RBC MORPHOLOGY: NORMAL
WBC MORPHOLOGY: NORMAL

## 2022-04-14 LAB — COMPREHENSIVE METABOLIC PANEL
ALT: 19 U/L (ref 0–44)
AST: 20 U/L (ref 15–41)
Albumin: 4.2 g/dL (ref 3.5–5.0)
Alkaline Phosphatase: 39 U/L (ref 38–126)
Anion gap: 8 (ref 5–15)
BUN: 24 mg/dL — ABNORMAL HIGH (ref 8–23)
CO2: 25 mmol/L (ref 22–32)
Calcium: 8.8 mg/dL — ABNORMAL LOW (ref 8.9–10.3)
Chloride: 105 mmol/L (ref 98–111)
Creatinine, Ser: 0.86 mg/dL (ref 0.44–1.00)
GFR, Estimated: >60 mL/min (ref >60)
Glucose, Bld: 91 mg/dL (ref 70–99)
Potassium: 4.4 mmol/L (ref 3.5–5.1)
Sodium: 138 mmol/L (ref 135–145)
Total Bilirubin: 0.6 mg/dL (ref 0.3–1.2)
Total Protein: 7 g/dL (ref 6.5–8.1)

## 2022-04-14 LAB — CBC WITH DIFFERENTIAL/PLATELET
Abs Immature Granulocytes: 0.01 10*3/uL (ref 0.00–0.07)
Basophils Absolute: 0 10*3/uL (ref 0.0–0.1)
Basophils Relative: 0 %
Eosinophils Absolute: 0.1 10*3/uL (ref 0.0–0.5)
Eosinophils Relative: 4 %
HCT: 36.9 % (ref 36.0–46.0)
Hemoglobin: 12.4 g/dL (ref 12.0–15.0)
Immature Granulocytes: 0 %
Lymphocytes Relative: 18 %
Lymphs Abs: 0.6 10*3/uL — ABNORMAL LOW (ref 0.7–4.0)
MCH: 32 pg (ref 26.0–34.0)
MCHC: 33.6 g/dL (ref 30.0–36.0)
MCV: 95.3 fL (ref 80.0–100.0)
Monocytes Absolute: 0.5 10*3/uL (ref 0.1–1.0)
Monocytes Relative: 15 %
Neutro Abs: 2 10*3/uL (ref 1.7–7.7)
Neutrophils Relative %: 63 %
Platelets: 149 10*3/uL — ABNORMAL LOW (ref 150–400)
RBC: 3.87 MIL/uL (ref 3.87–5.11)
RDW: 12.7 % (ref 11.5–15.5)
WBC: 3.2 10*3/uL — ABNORMAL LOW (ref 4.0–10.5)
nRBC: 0 % (ref 0.0–0.2)

## 2022-04-14 LAB — LACTATE DEHYDROGENASE: LDH: 147 U/L (ref 98–192)

## 2022-04-14 NOTE — Assessment & Plan Note (Signed)
#  History of AML, Clinically she is doing very well. Labs reviewed and discussed with patient Mild leukopenia, Chronic, monitor.

## 2022-04-14 NOTE — Progress Notes (Signed)
Pt here for follow up. No new concerns voiced.   

## 2022-04-14 NOTE — Progress Notes (Signed)
Hematology/Oncology Progress note [phone] [phone]      Patient Care Team: Kirk Ruths, MD as PCP - General (Internal Medicine)  ASSESSMENT & PLAN:   AML (acute myeloid leukemia) in remission [facility]) #History of AML, Clinically she is doing very well. Labs reviewed and discussed with patient Mild leukopenia, Chronic, monitor.  Thrombocytopenia (HCC) Counts are stable.  Continue observation.  Patient went back to follow-up in 9 months due to her work schedule. Patient is about 3 and half years post treatment. Recommend patient to follow-up in 1 months or earlier if clinically indicated. Orders Placed This Encounter  Procedures   Technologist smear review    Standing Status:   Future    Number of Occurrences:   1    Standing Expiration Date:   04/15/2023    Order Specific Question:   Clinical information:    Answer:   history of AML   CBC with Differential/Platelet    Standing Status:   Future    Standing Expiration Date:   04/15/2023   Comprehensive metabolic panel    Standing Status:   Future    Standing Expiration Date:   04/14/2023   Lactate dehydrogenase    Standing Status:   Future    Standing Expiration Date:   04/15/2023   Flow cytometry panel-leukemia/lymphoma work-up    Standing Status:   Future    Standing Expiration Date:   04/15/2023   Technologist smear review    Standing Status:   Future    Standing Expiration Date:   04/15/2023    Order Specific Question:   Clinical information:    Answer:   history of AML    All questions were answered. The patient knows to call the clinic with any problems, questions or concerns.  Earlie Server, MD, PhD [facility] Hematology Oncology 04/14/2022   CHIEF COMPLAINTS/REASON FOR VISIT:  Follow up for AML  HISTORY OF PRESENTING ILLNESS:   Jenna Wiley is a  71 y.o.  female presents for follow-up of history of AML  I obtained patient's past medical history and extensive medical  record review was performed.  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  Patient received leukemia chemotherapy at [facility] 08/2018 -09/05/2018 7+3 regimen. Day 14 marrow showed blasts <5%,  09/08/2018 bone marrow biopsy reviewed hypocellular marrow with 5-7% blasts, consistent with marrow recovery.  Office notes indicate that MRD testing was negative on the specimen.  NGS was also negative for WT1. 09/2018-10/24/2018 HIDAC x 2 cycles 11/02/2018 - 11/16/2018 Febrile neutropenia, strep viridans as well as aortic valve endocarditis with AV vegetation. She finished extended course of IV antibitotics.   12/12/2018 Bone marrow biopsy 10-20% cellularity, CR.  MRD by flow cytometry was negative. Concesus decision was made to forego further consolidation treatments. With plan for series CBC monitoring.   She moved to and followed up with Dr.Millenson [place] and have CBC monitored periodically.   05/2019 COVID 19 infection, managed with supportive care at home and symptoms resolved within 2 weeks.   10/15/2019 seen by Dr.Millenson. She was recommended for COVID vaccination and deferred at that time.  Blood work showed Wbc 3.8, Hb 11.2, hct 32.9, MCV 101, platelet 78000, neutrophil 76%, lymphocyte 11.7% ANC 2.9  Patient has moved to [facility] since then.  Today she reports feeling well.  No fever, chills, night sweats, unintentional weight loss.  Appetite is good.  She has  establish care with primary care provider Dr. Ouida Sills and was seen on 03/05/2020.  Patient was referred to establish care with hematology for surveillance.   INTERVAL HISTORY Jenna Wiley is a 71 y.o. female who has above history reviewed by me today presents for follow up visit for management of history of AML, chronic thrombocytopenia Patient has no new complaints.  She has good appetite. Denies weight loss, fever,  chills, fatigue, night sweats.  Patient has no new complaints.  . Review of Systems  Constitutional:  Negative for appetite change, chills, fatigue and fever.  HENT:   Negative for hearing loss and voice change.   Eyes:  Negative for eye problems.  Respiratory:  Negative for chest tightness and cough.   Cardiovascular:  Negative for chest pain.  Gastrointestinal:  Negative for abdominal distention, abdominal pain and blood in stool.  Endocrine: Negative for hot flashes.  Genitourinary:  Negative for difficulty urinating and frequency.   Musculoskeletal:  Negative for arthralgias.  Skin:  Negative for itching and rash.  Neurological:  Negative for extremity weakness.  Hematological:  Negative for adenopathy.  Psychiatric/Behavioral:  Negative for confusion.     MEDICAL HISTORY:  Past Medical History:  Diagnosis Date   Cancer ([place]) 07/09/2018   AML leukemia   Hypertension    Osteopenia    Personal history of chemotherapy     SURGICAL HISTORY: Past Surgical History:  Procedure Laterality Date   AUGMENTATION MAMMAPLASTY Bilateral    2006; replaced 2017   BREAST IMPLANT REMOVAL  2017   CESAREAN SECTION  1983   PERCUTANEOUS FIXATION TIBIAL SHAFT FRACTURE W/ PINS / SCREWS  2018   pin in thumb   PLACEMENT OF BREAST IMPLANTS  2006   SHOULDER ARTHROSCOPY WITH BANKART REPAIR Right 11/14/2020   Procedure: Right shoulder arthroscopic  Bankart repair- Reche Dixon to Assist;  Surgeon: Leim Fabry, MD;  Location: [place];  Service: Orthopedics;  Laterality: Right;    SOCIAL HISTORY: Social History   Socioeconomic History   Marital status: Married    Spouse name: Not on file   Number of children: Not on file   Years of education: Not on file   Highest education level: Not on file  Occupational History   Not on file  Tobacco Use   Smoking status: Never   Smokeless tobacco: Never  Vaping Use   Vaping Use: Never used  Substance and Sexual Activity   Alcohol use: Yes     Alcohol/week: 2.0 standard drinks of alcohol    Types: 2 Cans of beer per week    Comment: social drinker   Drug use: Not Currently   Sexual activity: Yes  Other Topics Concern   Not on file  Social History Narrative   Not on file   Social Determinants of Health   Financial Resource Strain: Not on file  Food Insecurity: Not on file  Transportation Needs: Not on file  Physical Activity: Not on file  Stress: Not on file  Social Connections: Not on file  Intimate Partner Violence: Not on file    FAMILY HISTORY: Family History  Problem Relation Age of Onset   Brain cancer Mother    Liver cancer Father    Hypertension Father    Breast cancer Neg Hx     ALLERGIES:  is allergic to nitrofurantoin.  MEDICATIONS:  Current Outpatient Medications  Medication Sig Dispense Refill   amLODipine (NORVASC) 5 MG tablet Take 1 tablet by mouth daily.     atorvastatin (  LIPITOR) 10 MG tablet Take 10 mg by mouth daily.     calcium citrate-vitamin D (CITRACAL+D) 315-200 MG-UNIT tablet Take by mouth.     Cholecalciferol 50 MCG (2000 UT) TABS Take by mouth.     COLLAGEN PO Take by mouth.     diphenhydrAMINE (BENADRYL) 25 mg capsule Take 25 mg by mouth every 6 (six) hours as needed.     ELDERBERRY PO Take by mouth.     MAGNESIUM PO Take 1 tablet by mouth daily.     Melatonin 10 MG TABS Take by mouth.     Multiple Vitamin (MULTIVITAMIN ADULT PO) Take by mouth.     Omega-3 Fatty Acids (FISH OIL PO) Take 1 capsule by mouth daily.     Probiotic Product (PROBIOTIC DAILY PO) Take by mouth daily.     TURMERIC PO Take by mouth.     vitamin B-12 (CYANOCOBALAMIN) 1000 MCG tablet Take 1,000 mcg by mouth daily.     No current facility-administered medications for this visit.     PHYSICAL EXAMINATION: ECOG PERFORMANCE STATUS: 0 - Asymptomatic Vitals:   04/14/22 0957  BP: (!) 148/84  Pulse: 61  Resp: 18  Temp: 97.9 F (36.6 C)   Filed Weights   04/14/22 0957  Weight: 170 lb 12.8 oz (77.5  kg)     Physical Exam Constitutional:      General: She is not in acute distress. HENT:     Head: Normocephalic and atraumatic.  Eyes:     General: No scleral icterus. Cardiovascular:     Rate and Rhythm: Normal rate and regular rhythm.     Heart sounds: Normal heart sounds.  Pulmonary:     Effort: Pulmonary effort is normal. No respiratory distress.     Breath sounds: No wheezing.  Abdominal:     General: Bowel sounds are normal. There is no distension.     Palpations: Abdomen is soft.  Musculoskeletal:        General: No deformity. Normal range of motion.     Cervical back: Normal range of motion and neck supple.  Skin:    General: Skin is warm and dry.     Findings: No erythema or rash.  Neurological:     Mental Status: She is alert and oriented to person, place, and time. Mental status is at baseline.     Cranial Nerves: No cranial nerve deficit.     Coordination: Coordination normal.  Psychiatric:        Mood and Affect: Mood normal.     LABORATORY DATA:  I have reviewed the data as listed Lab Results  Component Value Date   WBC 3.2 (L) 04/14/2022   HGB 12.4 04/14/2022   HCT 36.9 04/14/2022   MCV 95.3 04/14/2022   PLT 149 (L) 04/14/2022   Recent Labs    07/01/21 0757 04/14/22 0934  NA 138 138  K 4.3 4.4  CL 105 105  CO2 27 25  GLUCOSE 91 91  BUN 23 24*  CREATININE 0.98 0.86  CALCIUM 9.1 8.8*  GFRNONAA >60 >60  PROT 7.2 7.0  ALBUMIN 4.3 4.2  AST 18 20  ALT 16 19  ALKPHOS 37* 39  BILITOT 0.8 0.6    Iron/TIBC/Ferritin/ %Sat No results found for: "IRON", "TIBC", "FERRITIN", "IRONPCTSAT"    RADIOGRAPHIC STUDIES: I have personally reviewed the radiological images as listed and agreed with the findings in the report. No results found.

## 2022-04-14 NOTE — Assessment & Plan Note (Signed)
Counts are stable.  Continue observation.

## 2022-04-19 LAB — COMP PANEL: LEUKEMIA/LYMPHOMA

## 2022-05-24 ENCOUNTER — Ambulatory Visit
Admission: RE | Admit: 2022-05-24 | Discharge: 2022-05-24 | Disposition: A | Discharge status: Home / Self Care | Payer: Medicare HMO | Source: Ambulatory Visit | Attending: Internal Medicine | Admitting: Internal Medicine

## 2022-05-24 DIAGNOSIS — Z1231 Encounter for screening mammogram for malignant neoplasm of breast: Secondary | ICD-10-CM | POA: Diagnosis not present

## 2023-01-11 ENCOUNTER — Encounter: Payer: Self-pay | Admitting: Oncology

## 2023-01-11 ENCOUNTER — Inpatient Hospital Stay: Payer: Medicare HMO

## 2023-01-11 ENCOUNTER — Inpatient Hospital Stay: Payer: Medicare HMO | Attending: Oncology | Admitting: Oncology

## 2023-01-11 VITALS — BP 129/85 | HR 74 | Temp 98.0°F | Resp 18 | Wt 175.3 lb

## 2023-01-11 DIAGNOSIS — C9201 Acute myeloblastic leukemia, in remission: Secondary | ICD-10-CM | POA: Insufficient documentation

## 2023-01-11 DIAGNOSIS — Z79899 Other long term (current) drug therapy: Secondary | ICD-10-CM | POA: Diagnosis not present

## 2023-01-11 DIAGNOSIS — Z808 Family history of malignant neoplasm of other organs or systems: Secondary | ICD-10-CM | POA: Diagnosis not present

## 2023-01-11 DIAGNOSIS — D696 Thrombocytopenia, unspecified: Secondary | ICD-10-CM

## 2023-01-11 LAB — CBC WITH DIFFERENTIAL/PLATELET
Abs Immature Granulocytes: 0.02 10*3/uL (ref 0.00–0.07)
Basophils Absolute: 0 10*3/uL (ref 0.0–0.1)
Basophils Relative: 0 %
Eosinophils Absolute: 0.1 10*3/uL (ref 0.0–0.5)
Eosinophils Relative: 2 %
HCT: 37.3 % (ref 36.0–46.0)
Hemoglobin: 12.6 g/dL (ref 12.0–15.0)
Immature Granulocytes: 0 %
Lymphocytes Relative: 16 %
Lymphs Abs: 0.7 10*3/uL (ref 0.7–4.0)
MCH: 32.1 pg (ref 26.0–34.0)
MCHC: 33.8 g/dL (ref 30.0–36.0)
MCV: 94.9 fL (ref 80.0–100.0)
Monocytes Absolute: 0.5 10*3/uL (ref 0.1–1.0)
Monocytes Relative: 12 %
Neutro Abs: 3.1 10*3/uL (ref 1.7–7.7)
Neutrophils Relative %: 70 %
Platelets: 155 10*3/uL (ref 150–400)
RBC: 3.93 MIL/uL (ref 3.87–5.11)
RDW: 12.4 % (ref 11.5–15.5)
WBC: 4.5 10*3/uL (ref 4.0–10.5)
nRBC: 0 % (ref 0.0–0.2)

## 2023-01-11 LAB — COMPREHENSIVE METABOLIC PANEL
ALT: 17 U/L (ref 0–44)
AST: 21 U/L (ref 15–41)
Albumin: 4.5 g/dL (ref 3.5–5.0)
Alkaline Phosphatase: 31 U/L — ABNORMAL LOW (ref 38–126)
Anion gap: 7 (ref 5–15)
BUN: 23 mg/dL (ref 8–23)
CO2: 25 mmol/L (ref 22–32)
Calcium: 9.1 mg/dL (ref 8.9–10.3)
Chloride: 105 mmol/L (ref 98–111)
Creatinine, Ser: 0.97 mg/dL (ref 0.44–1.00)
GFR, Estimated: >60 mL/min (ref >60)
Glucose, Bld: 95 mg/dL (ref 70–99)
Potassium: 4.2 mmol/L (ref 3.5–5.1)
Sodium: 137 mmol/L (ref 135–145)
Total Bilirubin: 0.6 mg/dL (ref 0.3–1.2)
Total Protein: 7.1 g/dL (ref 6.5–8.1)

## 2023-01-11 LAB — TECHNOLOGIST SMEAR REVIEW
Plt Morphology: NORMAL
RBC MORPHOLOGY: NORMAL
WBC MORPHOLOGY: NORMAL

## 2023-01-11 LAB — LACTATE DEHYDROGENASE: LDH: 154 U/L (ref 98–192)

## 2023-01-11 NOTE — Assessment & Plan Note (Addendum)
#  History of AML, Clinically she is doing very well. She is 4+ years after completing treatment.  Labs reviewed and discussed with patient Mild leukopenia,resolved.

## 2023-01-11 NOTE — Progress Notes (Signed)
Hematology/Oncology Progress note [phone] [phone]      Patient Care Team: Lauro Regulus, MD as PCP - General (Internal Medicine)  ASSESSMENT & PLAN:   AML (acute myeloid leukemia) in remission [facility]) #History of AML, Clinically she is doing very well. She is 4+ years after completing treatment.  Labs reviewed and discussed with patient Mild leukopenia,resolved.  follow-up in 9 months  Orders Placed This Encounter  Procedures   CBC with Differential (Cancer Center Only)    Standing Status:   Future    Standing Expiration Date:   01/11/2024   CMP (Cancer Center only)    Standing Status:   Future    Standing Expiration Date:   01/11/2024   Lactate dehydrogenase    Standing Status:   Future    Standing Expiration Date:   01/11/2024   Technologist smear review    Standing Status:   Future    Standing Expiration Date:   01/11/2024    Order Specific Question:   Clinical information:    Answer:   AML   Flow cytometry panel-leukemia/lymphoma work-up    Standing Status:   Future    Standing Expiration Date:   01/11/2024    All questions were answered. The patient knows to call the clinic with any problems, questions or concerns.  Rickard Patience, MD, PhD [facility] Hematology Oncology 01/11/2023   CHIEF COMPLAINTS/REASON FOR VISIT:  Follow up for AML  HISTORY OF PRESENTING ILLNESS:   Jenna Wiley is a  72 y.o.  female presents for follow-up of history of AML  I obtained patient's past medical history and extensive medical record review was performed.  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  Patient received leukemia chemotherapy at [facility]- Dr.Kathleen Dorritie 08/2018 -09/05/2018 7+3 regimen. Day 14 marrow showed blasts <5%,  09/08/2018 bone marrow biopsy reviewed hypocellular marrow with 5-7% blasts, consistent with marrow recovery.  Office notes  indicate that MRD testing was negative on the specimen.  NGS was also negative for WT1. 09/2018-10/24/2018 HIDAC x 2 cycles 11/02/2018 - 11/16/2018 Febrile neutropenia, strep viridans as well as aortic valve endocarditis with AV vegetation. She finished extended course of IV antibitotics.   12/12/2018 Bone marrow biopsy 10-20% cellularity, CR.  MRD by flow cytometry was negative. Concesus decision was made to forego further consolidation treatments. With plan for series CBC monitoring.   She moved to and followed up with Dr.Millenson [place] and have CBC monitored periodically.   05/2019 COVID 19 infection, managed with supportive care at home and symptoms resolved within 2 weeks.   10/15/2019 seen by Dr.Millenson. She was recommended for COVID vaccination and deferred at that time.  Blood work showed Wbc 3.8, Hb 11.2, hct 32.9, MCV 101, platelet 78000, neutrophil 76%, lymphocyte 11.7% ANC 2.9  Patient has moved to [facility] since then.  Today she reports feeling well.  No fever, chills, night sweats, unintentional weight loss.  Appetite is good.  She has establish care with primary care provider Dr. Dareen Piano and was seen on 03/05/2020.  Patient was referred to establish care with hematology for surveillance.   INTERVAL HISTORY Jenna Wiley is a 72 y.o. female who has above history reviewed by me today presents for follow up visit for management of history of AML, chronic thrombocytopenia Patient has no new complaints.  She has good appetite. Denies weight loss, fever, chills, fatigue, night sweats.  Patient has no new complaints.  Marland Kitchen  Review of Systems  Constitutional:  Negative for appetite change, chills, fatigue and fever.  HENT:   Negative for hearing loss and voice change.   Eyes:  Negative for eye problems.  Respiratory:  Negative for chest tightness and cough.   Cardiovascular:  Negative for chest pain.  Gastrointestinal:  Negative for abdominal distention, abdominal pain and  blood in stool.  Endocrine: Negative for hot flashes.  Genitourinary:  Negative for difficulty urinating and frequency.   Musculoskeletal:  Negative for arthralgias.  Skin:  Negative for itching and rash.  Neurological:  Negative for extremity weakness.  Hematological:  Negative for adenopathy.  Psychiatric/Behavioral:  Negative for confusion.     MEDICAL HISTORY:  Past Medical History:  Diagnosis Date   Cancer (HCC) 07/09/2018   AML leukemia   Hypertension    Osteopenia    Personal history of chemotherapy     SURGICAL HISTORY: Past Surgical History:  Procedure Laterality Date   AUGMENTATION MAMMAPLASTY Bilateral    2006; replaced 2017   BREAST IMPLANT REMOVAL  2017   CESAREAN SECTION  1983   PERCUTANEOUS FIXATION TIBIAL SHAFT FRACTURE W/ PINS / SCREWS  2018   pin in thumb   PLACEMENT OF BREAST IMPLANTS  2006   SHOULDER ARTHROSCOPY WITH BANKART REPAIR Right 11/14/2020   Procedure: Right shoulder arthroscopic  Bankart repair- Dedra Skeens to Assist;  Surgeon: Signa Kell, MD;  Location[facility];  Service: Orthopedics;  Laterality: Right;    SOCIAL HISTORY: Social History   Socioeconomic History   Marital status: Married    Spouse name: Not on file   Number of children: Not on file   Years of education: Not on file   Highest education level: Not on file  Occupational History   Not on file  Tobacco Use   Smoking status: Never   Smokeless tobacco: Never  Vaping Use   Vaping status: Never Used  Substance and Sexual Activity   Alcohol use: Yes    Alcohol/week: 2.0 standard drinks of alcohol    Types: 2 Cans of beer per week    Comment: social drinker   Drug use: Not Currently   Sexual activity: Yes  Other Topics Concern   Not on file  Social History Narrative   Not on file   Social Determinants of Health   Financial Resource Strain: Not on file  Food Insecurity: Not on file  Transportation Needs: Not on file  Physical Activity: Not on file   Stress: Not on file  Social Connections: Not on file  Intimate Partner Violence: Not on file    FAMILY HISTORY: Family History  Problem Relation Age of Onset   Brain cancer Mother    Liver cancer Father    Hypertension Father    Breast cancer Neg Hx     ALLERGIES:  is allergic to nitrofurantoin.  MEDICATIONS:  Current Outpatient Medications  Medication Sig Dispense Refill   amLODipine (NORVASC) 5 MG tablet Take 1 tablet by mouth daily.     atorvastatin (LIPITOR) 10 MG tablet Take 10 mg by mouth daily.     calcium citrate-vitamin D (CITRACAL+D) 315-200 MG-UNIT tablet Take by mouth.     Cholecalciferol 50 MCG (2000 UT) TABS Take by mouth.     COLLAGEN PO Take by mouth.     diphenhydrAMINE (BENADRYL) 25 mg capsule Take 25 mg by mouth every 6 (six) hours as needed.     ELDERBERRY PO Take by mouth.     MAGNESIUM PO Take  1 tablet by mouth daily.     Melatonin 10 MG TABS Take by mouth.     Multiple Vitamin (MULTIVITAMIN ADULT PO) Take by mouth.     Omega-3 Fatty Acids (FISH OIL PO) Take 1 capsule by mouth daily.     Probiotic Product (PROBIOTIC DAILY PO) Take by mouth daily.     TURMERIC PO Take by mouth.     vitamin B-12 (CYANOCOBALAMIN) 1000 MCG tablet Take 1,000 mcg by mouth daily.     No current facility-administered medications for this visit.     PHYSICAL EXAMINATION: ECOG PERFORMANCE STATUS: 0 - Asymptomatic Vitals:   01/11/23 1013  BP: 129/85  Pulse: 74  Resp: 18  Temp: 98 F (36.7 C)  SpO2: 100%   Filed Weights   01/11/23 1013  Weight: 175 lb 4.8 oz (79.5 kg)     Physical Exam Constitutional:      General: She is not in acute distress. HENT:     Head: Normocephalic and atraumatic.  Eyes:     General: No scleral icterus. Cardiovascular:     Rate and Rhythm: Normal rate and regular rhythm.     Heart sounds: Normal heart sounds.  Pulmonary:     Effort: Pulmonary effort is normal. No respiratory distress.     Breath sounds: No wheezing.  Abdominal:      General: Bowel sounds are normal. There is no distension.     Palpations: Abdomen is soft.  Musculoskeletal:        General: No deformity. Normal range of motion.     Cervical back: Normal range of motion and neck supple.  Skin:    General: Skin is warm and dry.     Findings: No erythema or rash.  Neurological:     Mental Status: She is alert and oriented to person, place, and time. Mental status is at baseline.     Cranial Nerves: No cranial nerve deficit.     Coordination: Coordination normal.  Psychiatric:        Mood and Affect: Mood normal.     LABORATORY DATA:  I have reviewed the data as listed Lab Results  Component Value Date   WBC 4.5 01/11/2023   HGB 12.6 01/11/2023   HCT 37.3 01/11/2023   MCV 94.9 01/11/2023   PLT 155 01/11/2023   Recent Labs    04/14/22 0934 01/11/23 1000  NA 138 137  K 4.4 4.2  CL 105 105  CO2 25 25  GLUCOSE 91 95  BUN 24* 23  CREATININE 0.86 0.97  CALCIUM 8.8* 9.1  GFRNONAA >60 >60  PROT 7.0 7.1  ALBUMIN 4.2 4.5  AST 20 21  ALT 19 17  ALKPHOS 39 31*  BILITOT 0.6 0.6   RADIOGRAPHIC STUDIES: I have personally reviewed the radiological images as listed and agreed with the findings in the report. No results found.

## 2023-01-13 IMAGING — DX DG SHOULDER 2+V PORT*R*
2 series · 2 of 2 positions shown · non-contrast
Comparison: 05/30/2020

CLINICAL DATA: Postreduction

EXAM:
PORTABLE RIGHT SHOULDER

[shoulder ap]
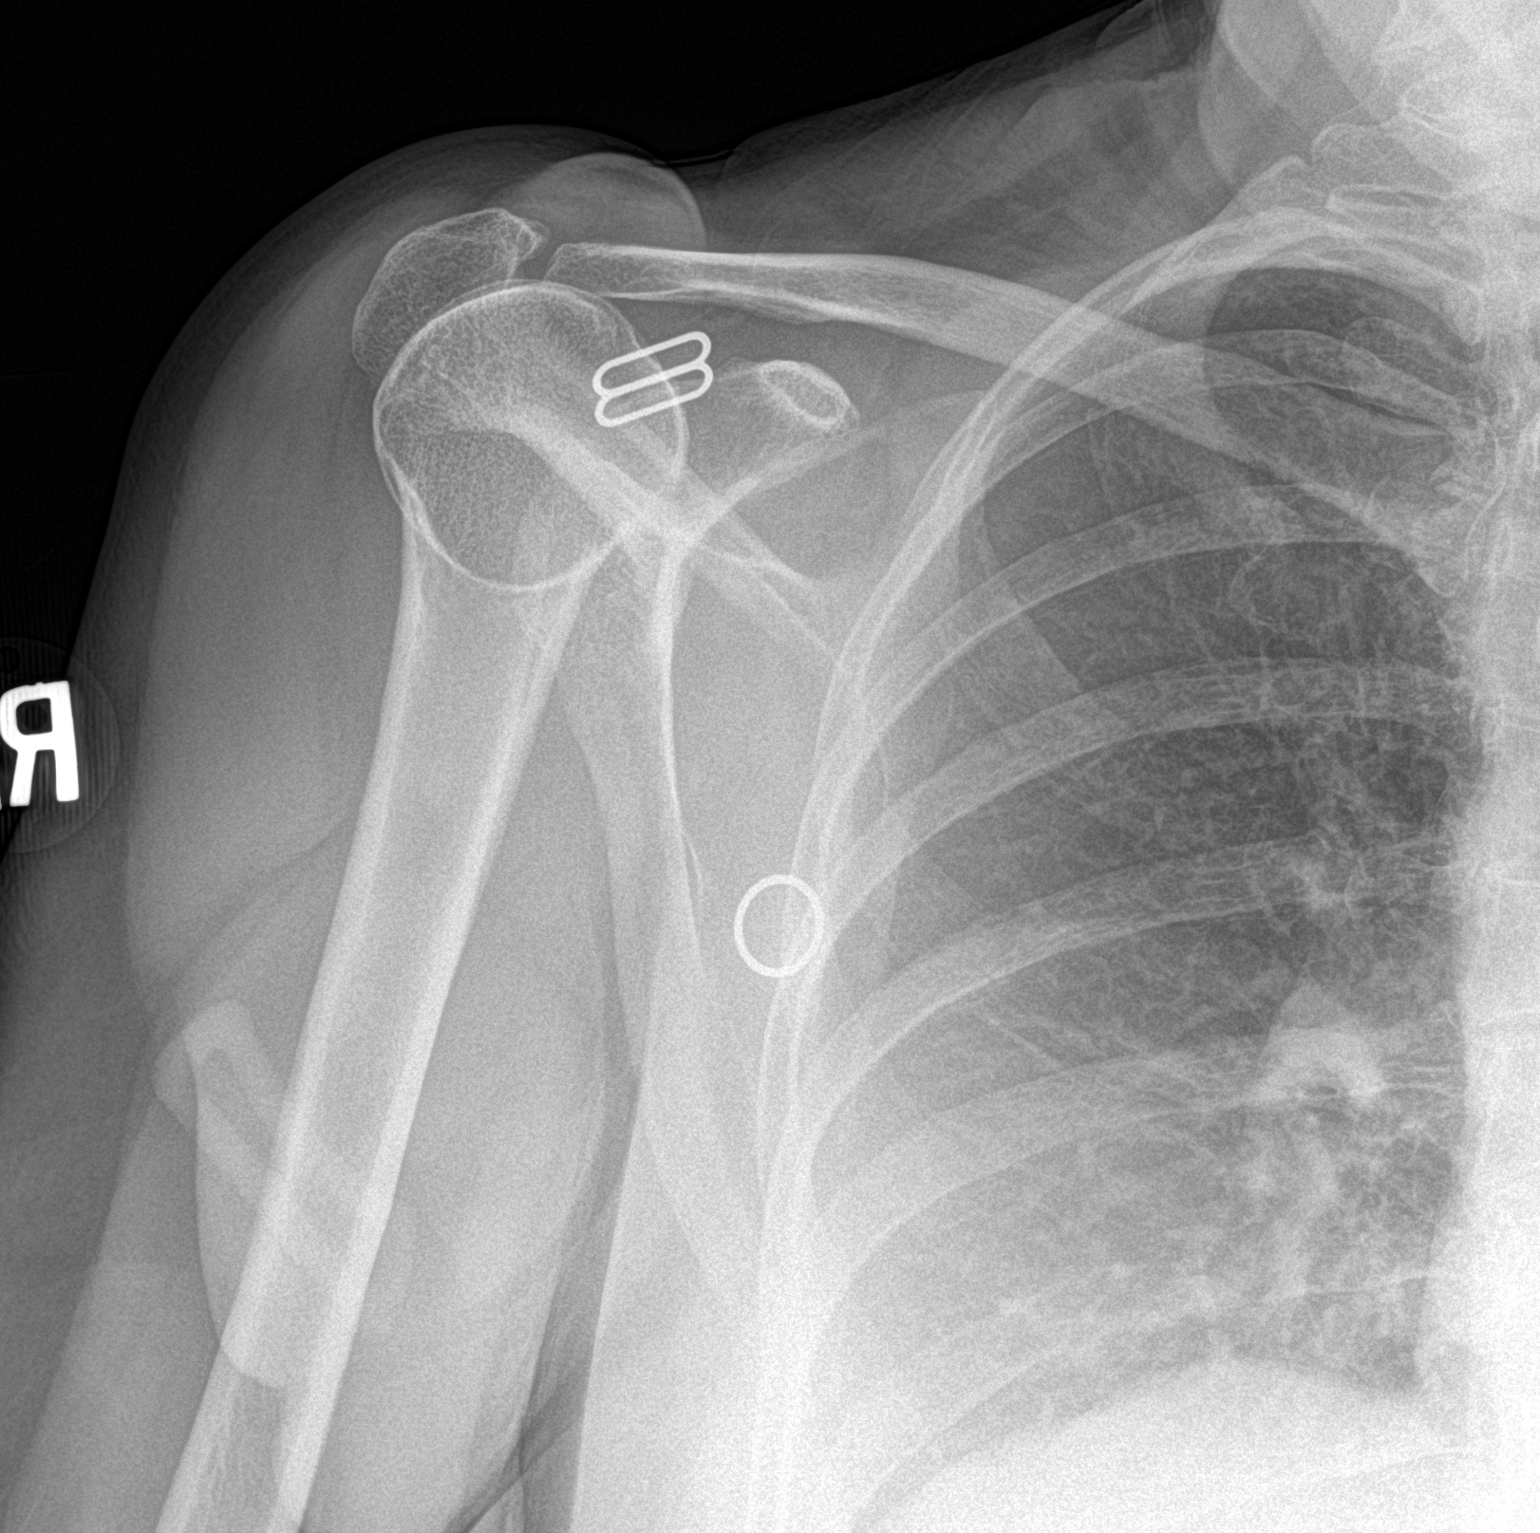

[shoulder obl]
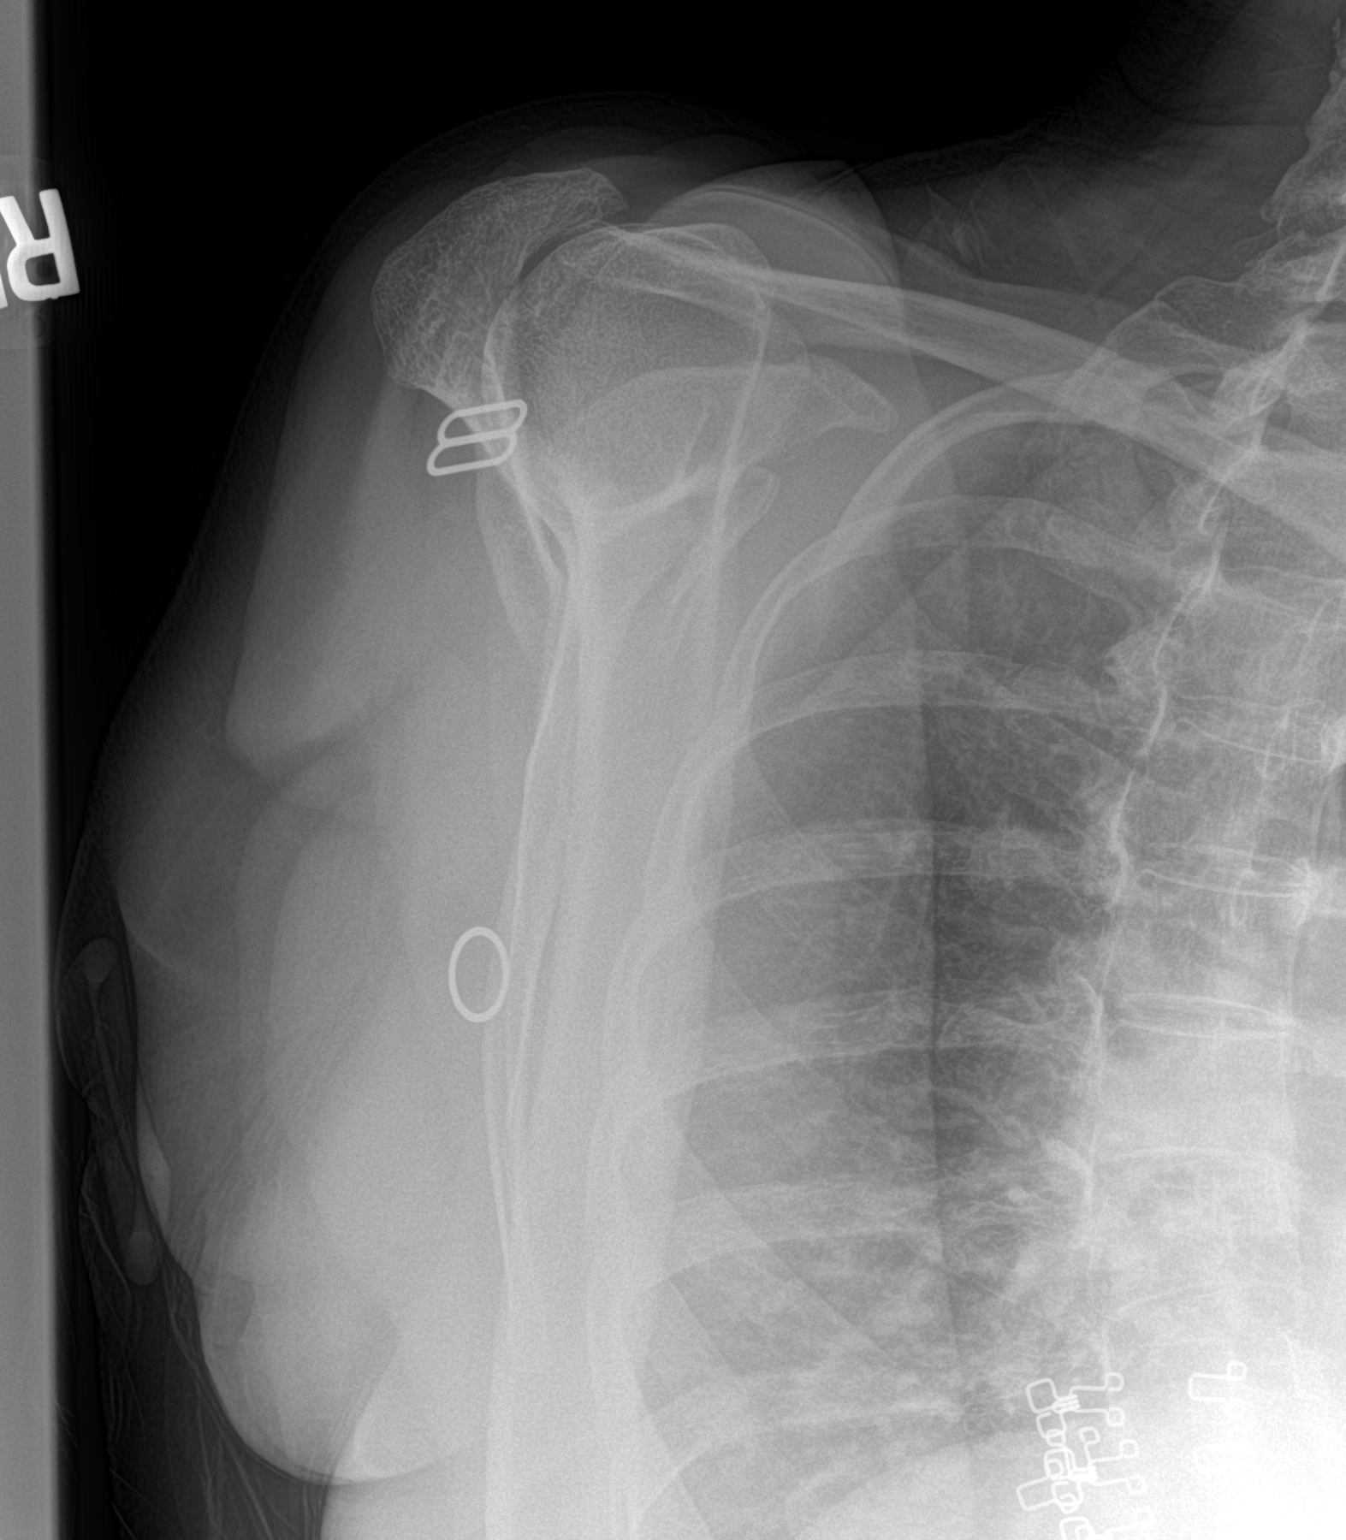

[2 of 2 positions shown; findings below may reference images not displayed]

FINDINGS: Interval relocation of the right shoulder with anatomic appearance
of the glenohumeral joint on the views obtained. No acute fractures
are identified. Soft tissues are unremarkable.
IMPRESSION: Anatomic appearance of the right shoulder postreduction.

## 2023-01-14 LAB — COMP PANEL: LEUKEMIA/LYMPHOMA

## 2023-01-18 ENCOUNTER — Other Ambulatory Visit: Payer: Medicare HMO

## 2023-01-18 ENCOUNTER — Ambulatory Visit: Payer: Medicare HMO | Admitting: Oncology

## 2023-04-22 ENCOUNTER — Other Ambulatory Visit: Payer: Self-pay | Admitting: Internal Medicine

## 2023-04-22 DIAGNOSIS — Z1231 Encounter for screening mammogram for malignant neoplasm of breast: Secondary | ICD-10-CM

## 2023-07-19 ENCOUNTER — Ambulatory Visit
Admission: RE | Admit: 2023-07-19 | Discharge: 2023-07-19 | Disposition: A | Discharge status: Home / Self Care | Source: Ambulatory Visit | Attending: Internal Medicine | Admitting: Internal Medicine

## 2023-07-19 DIAGNOSIS — Z1231 Encounter for screening mammogram for malignant neoplasm of breast: Secondary | ICD-10-CM | POA: Diagnosis present

## 2023-08-22 ENCOUNTER — Ambulatory Visit: Admit: 2023-08-22 | Payer: Medicare HMO | Admitting: Gastroenterology

## 2023-08-22 SURGERY — COLONOSCOPY WITH PROPOFOL
Anesthesia: General

## 2023-09-25 LAB — COLOGUARD: COLOGUARD: POSITIVE — AB

## 2023-10-11 ENCOUNTER — Inpatient Hospital Stay: Payer: Medicare HMO | Admitting: Oncology

## 2023-10-11 ENCOUNTER — Inpatient Hospital Stay: Payer: Medicare HMO | Attending: Oncology

## 2023-10-11 ENCOUNTER — Encounter: Payer: Self-pay | Admitting: Oncology

## 2023-10-11 VITALS — BP 138/79 | HR 72 | Temp 97.4°F | Resp 18 | Wt 176.5 lb

## 2023-10-11 DIAGNOSIS — Z79899 Other long term (current) drug therapy: Secondary | ICD-10-CM | POA: Diagnosis not present

## 2023-10-11 DIAGNOSIS — C9201 Acute myeloblastic leukemia, in remission: Secondary | ICD-10-CM

## 2023-10-11 DIAGNOSIS — Z808 Family history of malignant neoplasm of other organs or systems: Secondary | ICD-10-CM | POA: Diagnosis not present

## 2023-10-11 DIAGNOSIS — Z9221 Personal history of antineoplastic chemotherapy: Secondary | ICD-10-CM | POA: Insufficient documentation

## 2023-10-11 DIAGNOSIS — D696 Thrombocytopenia, unspecified: Secondary | ICD-10-CM | POA: Diagnosis present

## 2023-10-11 LAB — LACTATE DEHYDROGENASE: LDH: 148 U/L (ref 98–192)

## 2023-10-11 LAB — CBC WITH DIFFERENTIAL (CANCER CENTER ONLY)
Abs Immature Granulocytes: 0.01 10*3/uL (ref 0.00–0.07)
Basophils Absolute: 0 10*3/uL (ref 0.0–0.1)
Basophils Relative: 0 %
Eosinophils Absolute: 0.1 10*3/uL (ref 0.0–0.5)
Eosinophils Relative: 2 %
HCT: 35.4 % — ABNORMAL LOW (ref 36.0–46.0)
Hemoglobin: 12 g/dL (ref 12.0–15.0)
Immature Granulocytes: 0 %
Lymphocytes Relative: 13 %
Lymphs Abs: 0.6 10*3/uL — ABNORMAL LOW (ref 0.7–4.0)
MCH: 31.7 pg (ref 26.0–34.0)
MCHC: 33.9 g/dL (ref 30.0–36.0)
MCV: 93.4 fL (ref 80.0–100.0)
Monocytes Absolute: 0.5 10*3/uL (ref 0.1–1.0)
Monocytes Relative: 10 %
Neutro Abs: 3.6 10*3/uL (ref 1.7–7.7)
Neutrophils Relative %: 75 %
Platelet Count: 161 10*3/uL (ref 150–400)
RBC: 3.79 MIL/uL — ABNORMAL LOW (ref 3.87–5.11)
RDW: 12.7 % (ref 11.5–15.5)
WBC Count: 4.8 10*3/uL (ref 4.0–10.5)
nRBC: 0 % (ref 0.0–0.2)

## 2023-10-11 LAB — CMP (CANCER CENTER ONLY)
ALT: 17 U/L (ref 0–44)
AST: 21 U/L (ref 15–41)
Albumin: 4.1 g/dL (ref 3.5–5.0)
Alkaline Phosphatase: 36 U/L — ABNORMAL LOW (ref 38–126)
Anion gap: 9 (ref 5–15)
BUN: 23 mg/dL (ref 8–23)
CO2: 22 mmol/L (ref 22–32)
Calcium: 8.5 mg/dL — ABNORMAL LOW (ref 8.9–10.3)
Chloride: 107 mmol/L (ref 98–111)
Creatinine: 1.01 mg/dL — ABNORMAL HIGH (ref 0.44–1.00)
GFR, Estimated: 60 mL/min — ABNORMAL LOW (ref >60)
Glucose, Bld: 90 mg/dL (ref 70–99)
Potassium: 4.1 mmol/L (ref 3.5–5.1)
Sodium: 138 mmol/L (ref 135–145)
Total Bilirubin: 0.8 mg/dL (ref 0.0–1.2)
Total Protein: 6.7 g/dL (ref 6.5–8.1)

## 2023-10-11 LAB — TECHNOLOGIST SMEAR REVIEW: Plt Morphology: ADEQUATE

## 2023-10-11 NOTE — Assessment & Plan Note (Signed)
Recommend otc calcium supplementation.

## 2023-10-11 NOTE — Assessment & Plan Note (Addendum)
#  History of AML, Labs reviewed and discussed with patient Mild leukopenia,resolved.  Peripheral flowcytometry is pending.  Clinically she is doing very well. She is 5 years after completing treatment.  Recommend CBC with differential annually. She elects to follow up with Dr. Alva Jewels for further surveillance.

## 2023-10-11 NOTE — Assessment & Plan Note (Signed)
 Count has normalized.   Continue observation.

## 2023-10-11 NOTE — Progress Notes (Signed)
 Hematology/Oncology Progress note [phone] [phone]      Patient Care Team: Jimmy Moulding, MD as PCP - General (Internal Medicine) Timmy Forbes, MD as Consulting Physician (Oncology)  ASSESSMENT & PLAN:   AML (acute myeloid leukemia) in remission [facility]) #History of AML, Labs reviewed and discussed with patient Mild leukopenia,resolved.  Peripheral flowcytometry is pending.  Clinically she is doing very well. She is 5 years after completing treatment.  Recommend CBC with differential annually. She elects to follow up with Dr. Alva Jewels for further surveillance.    Thrombocytopenia (HCC) Count has normalized.   Continue observation.  Hypocalcemia Recommend otc calcium supplementation.  Patient is discharged from my clinic. I recommend patient to continue follow up with primary care physician. Patient may re-establish care in the future if clinically indicated.  No orders of the defined types were placed in this encounter. All questions were answered. The patient knows to call the clinic with any problems, questions or concerns.  Timmy Forbes, MD, PhD [facility] Hematology Oncology 10/11/2023   CHIEF COMPLAINTS/REASON FOR VISIT:  Follow up for AML  HISTORY OF PRESENTING ILLNESS:   Jenna Wiley is a  73 y.o.  female presents for follow-up of history of AML  I obtained patient's past medical history and extensive medical record review was performed.  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  Patient received leukemia chemotherapy at [facility]- Dr.Kathleen Dorritie 08/2018 -09/05/2018 7+3 regimen. Day 14 marrow showed blasts <5%,  09/08/2018 bone marrow biopsy reviewed hypocellular marrow with 5-7% blasts, consistent with marrow recovery.  Office notes indicate that MRD testing was negative on the specimen.  NGS was also negative for WT1. 09/2018-10/24/2018  HIDAC x 2 cycles 11/02/2018 - 11/16/2018 Febrile neutropenia, strep viridans as well as aortic valve endocarditis with AV vegetation. She finished extended course of IV antibitotics.   12/12/2018 Bone marrow biopsy 10-20% cellularity, CR.  MRD by flow cytometry was negative. Concesus decision was made to forego further consolidation treatments. With plan for series CBC monitoring.   She moved to and followed up with Dr.Millenson Bambi Lever and have CBC monitored periodically.   05/2019 COVID 19 infection, managed with supportive care at home and symptoms resolved within 2 weeks.   10/15/2019 seen by Dr.Millenson. She was recommended for COVID vaccination and deferred at that time.  Blood work showed Wbc 3.8, Hb 11.2, hct 32.9, MCV 101, platelet 78000, neutrophil 76%, lymphocyte 11.7% ANC 2.9  Patient has moved to [place]  since then.  Today she reports feeling well.  No fever, chills, night sweats, unintentional weight loss.  Appetite is good.  She has establish care with primary care provider Dr. Alva Jewels and was seen on 03/05/2020.  Patient was referred to establish care with hematology for surveillance.   INTERVAL HISTORY Jenna Wiley is a 73 y.o. female who has above history reviewed by me today presents for follow up visit for management of history of AML, chronic thrombocytopenia Patient has no new complaints.  She has good appetite. Denies weight loss, fever, chills, fatigue, night sweats.  Patient has no new complaints.  . Review of Systems  Constitutional:  Negative for appetite change, chills, fatigue and fever.  HENT:   Negative for hearing loss and voice change.   Eyes:  Negative for eye problems.  Respiratory:  Negative for chest tightness and cough.   Cardiovascular:  Negative for chest pain.  Gastrointestinal:  Negative for abdominal distention,  abdominal pain and blood in stool.  Endocrine: Negative for hot flashes.  Genitourinary:  Negative for difficulty urinating  and frequency.   Musculoskeletal:  Negative for arthralgias.  Skin:  Negative for itching and rash.  Neurological:  Negative for extremity weakness.  Hematological:  Negative for adenopathy.  Psychiatric/Behavioral:  Negative for confusion.     MEDICAL HISTORY:  Past Medical History:  Diagnosis Date   Cancer (HCC) 07/09/2018   AML leukemia   Hypertension    Osteopenia    Personal history of chemotherapy     SURGICAL HISTORY: Past Surgical History:  Procedure Laterality Date   AUGMENTATION MAMMAPLASTY Bilateral    2006; replaced 2017   BREAST IMPLANT REMOVAL  2017   CESAREAN SECTION  1983   PERCUTANEOUS FIXATION TIBIAL SHAFT FRACTURE W/ PINS / SCREWS  2018   pin in thumb   PLACEMENT OF BREAST IMPLANTS  2006   SHOULDER ARTHROSCOPY WITH BANKART REPAIR Right 11/14/2020   Procedure: Right shoulder arthroscopic  Bankart repair- Bert Britain to Assist;  Surgeon: Lorri Rota, MD;  Location: [facility];  Service: Orthopedics;  Laterality: Right;    SOCIAL HISTORY: Social History   Socioeconomic History   Marital status: Married    Spouse name: Not on file   Number of children: Not on file   Years of education: Not on file   Highest education level: Not on file  Occupational History   Not on file  Tobacco Use   Smoking status: Never   Smokeless tobacco: Never  Vaping Use   Vaping status: Never Used  Substance and Sexual Activity   Alcohol use: Yes    Alcohol/week: 2.0 standard drinks of alcohol    Types: 2 Cans of beer per week    Comment: social drinker   Drug use: Not Currently   Sexual activity: Yes  Other Topics Concern   Not on file  Social History Narrative   Not on file   Social Drivers of Health   Financial Resource Strain: Low Risk  (03/23/2023)   Received from [facility]   Overall Financial Resource Strain (CARDIA)    Difficulty of Paying Living Expenses: Not hard at all  Food Insecurity: No Food Insecurity (03/23/2023)    Received from [facility]   Hunger Vital Sign    Worried About Running Out of Food in the Last Year: Never true    Ran Out of Food in the Last Year: Never true  Transportation Needs: No Transportation Needs (03/23/2023)   Received from [facility] - Transportation    In the past 12 months, has lack of transportation kept you from medical appointments or from getting medications?: No    Lack of Transportation (Non-Medical): No  Physical Activity: Not on file  Stress: Not on file  Social Connections: Not on file  Intimate Partner Violence: Not on file    FAMILY HISTORY: Family History  Problem Relation Age of Onset   Brain cancer Mother    Liver cancer Father    Hypertension Father    Breast cancer Neg Hx     ALLERGIES:  is allergic to nitrofurantoin.  MEDICATIONS:  Current Outpatient Medications  Medication Sig Dispense Refill   amLODipine (NORVASC) 5 MG tablet Take 1 tablet by mouth daily.     atorvastatin (LIPITOR) 10 MG tablet Take 10 mg by mouth daily.     calcium citrate-vitamin D (CITRACAL+D) 315-200 MG-UNIT tablet Take by mouth.  Cholecalciferol 50 MCG (2000 UT) TABS Take by mouth.     COLLAGEN PO Take by mouth.     diphenhydrAMINE (BENADRYL) 25 mg capsule Take 25 mg by mouth every 6 (six) hours as needed.     ELDERBERRY PO Take by mouth.     MAGNESIUM PO Take 1 tablet by mouth daily.     Melatonin 10 MG TABS Take by mouth.     Multiple Vitamin (MULTIVITAMIN ADULT PO) Take by mouth.     Omega-3 Fatty Acids (FISH OIL PO) Take 1 capsule by mouth daily.     omeprazole (PRILOSEC) 20 MG capsule Take 20 mg by mouth daily.     Probiotic Product (PROBIOTIC DAILY PO) Take by mouth daily.     TURMERIC PO Take by mouth.     vitamin B-12 (CYANOCOBALAMIN) 1000 MCG tablet Take 1,000 mcg by mouth daily.     No current facility-administered medications for this visit.     PHYSICAL EXAMINATION: ECOG PERFORMANCE STATUS: 0 -  Asymptomatic Vitals:   10/11/23 0953  BP: 138/79  Pulse: 72  Resp: 18  Temp: (!) 97.4 F (36.3 C)  SpO2: 100%   Filed Weights   10/11/23 0953  Weight: 176 lb 8 oz (80.1 kg)     Physical Exam Constitutional:      General: She is not in acute distress. HENT:     Head: Normocephalic and atraumatic.  Eyes:     General: No scleral icterus. Cardiovascular:     Rate and Rhythm: Normal rate and regular rhythm.     Heart sounds: Normal heart sounds.  Pulmonary:     Effort: Pulmonary effort is normal. No respiratory distress.     Breath sounds: No wheezing.  Abdominal:     General: Bowel sounds are normal. There is no distension.     Palpations: Abdomen is soft.  Musculoskeletal:        General: No deformity. Normal range of motion.     Cervical back: Normal range of motion and neck supple.  Skin:    General: Skin is warm and dry.     Findings: No erythema or rash.  Neurological:     Mental Status: She is alert and oriented to person, place, and time. Mental status is at baseline.     Cranial Nerves: No cranial nerve deficit.     Coordination: Coordination normal.  Psychiatric:        Mood and Affect: Mood normal.     LABORATORY DATA:  I have reviewed the data as listed Lab Results  Component Value Date   WBC 4.8 10/11/2023   HGB 12.0 10/11/2023   HCT 35.4 (L) 10/11/2023   MCV 93.4 10/11/2023   PLT 161 10/11/2023   Recent Labs    01/11/23 1000 10/11/23 0939  NA 137 138  K 4.2 4.1  CL 105 107  CO2 25 22  GLUCOSE 95 90  BUN 23 23  CREATININE 0.97 1.01*  CALCIUM 9.1 8.5*  GFRNONAA >60 60*  PROT 7.1 6.7  ALBUMIN 4.5 4.1  AST 21 21  ALT 17 17  ALKPHOS 31* 36*  BILITOT 0.6 0.8   RADIOGRAPHIC STUDIES: I have personally reviewed the radiological images as listed and agreed with the findings in the report. No results found.

## 2023-10-13 LAB — COMP PANEL: LEUKEMIA/LYMPHOMA

## 2024-01-03 IMAGING — MG DIGITAL SCREENING BREAST BILAT IMPLANT W/ TOMO W/ CAD
9 of 12 series · 9 of 28 positions shown · non-contrast
Comparison: Previous exam(s).

CLINICAL DATA: Screening.

EXAM:
DIGITAL SCREENING BILATERAL MAMMOGRAM WITH IMPLANTS, CAD AND
TOMOSYNTHESIS
TECHNIQUE: Bilateral screening digital craniocaudal and mediolateral oblique
mammograms were obtained. Bilateral screening digital breast
tomosynthesis was performed. The images were evaluated with
computer-aided detection. Standard and/or implant displaced views
were performed.

[L MLO]
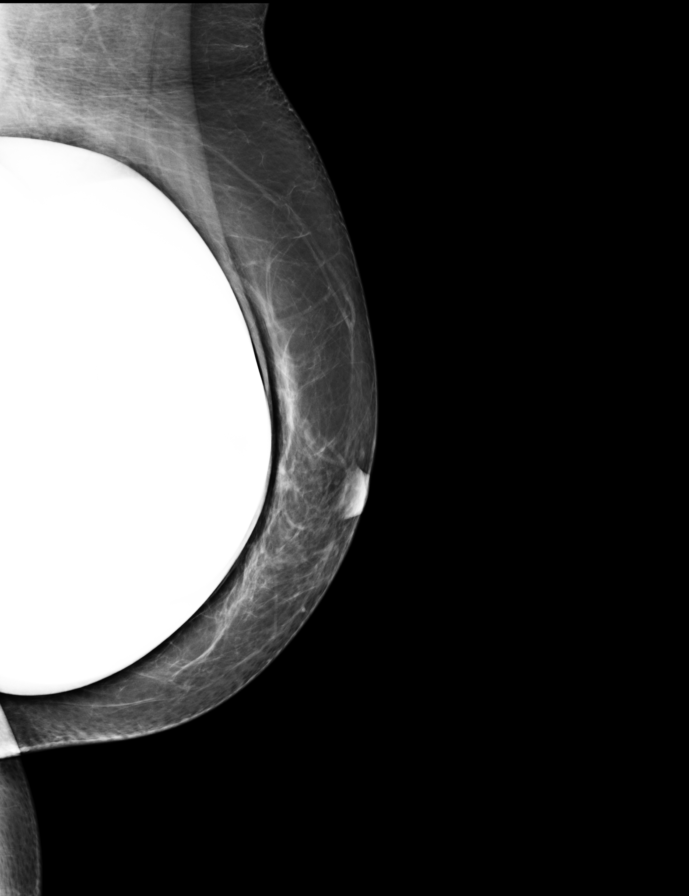

[R MLO]
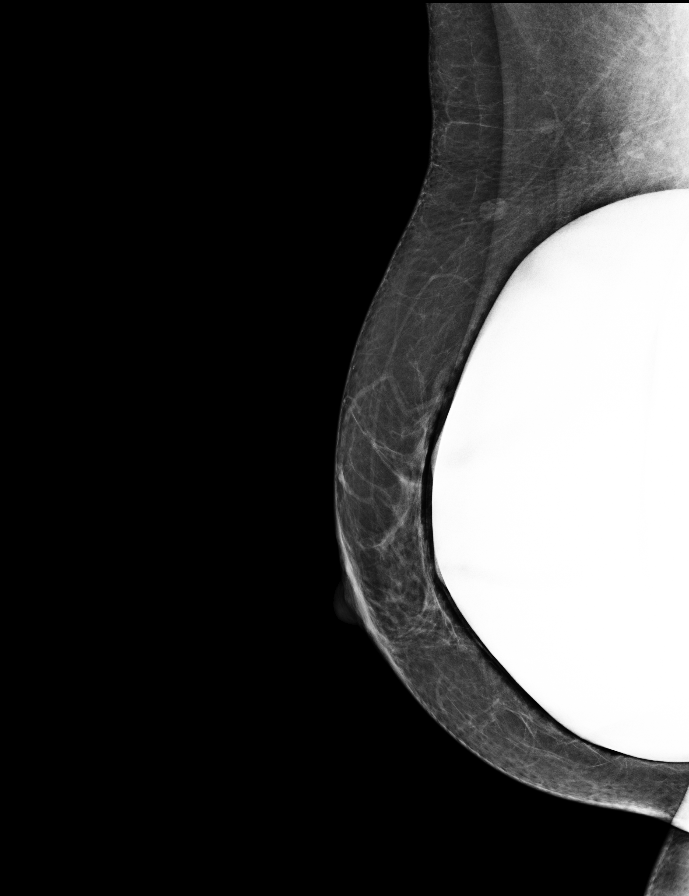

[R CC]
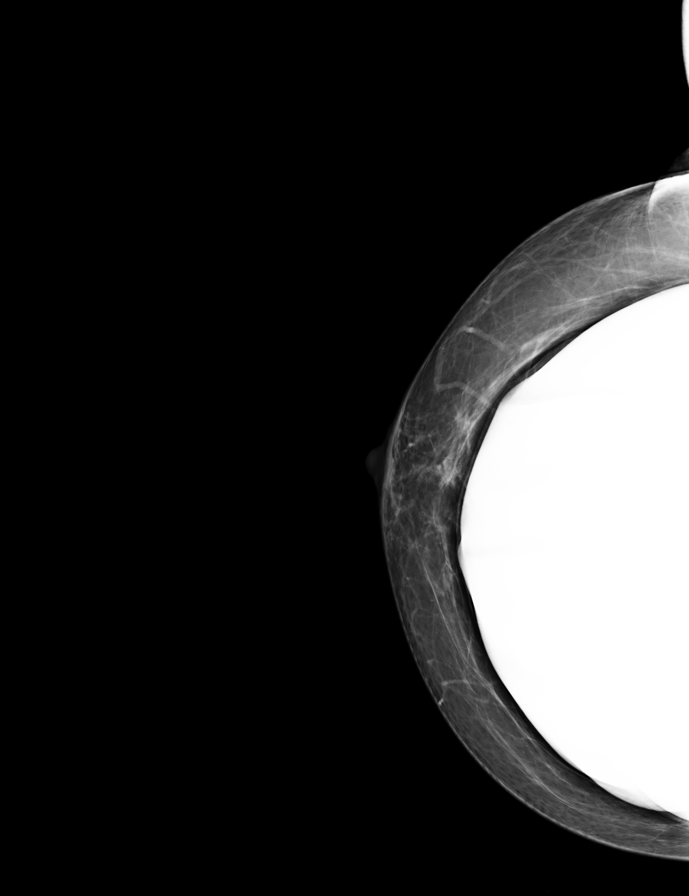

[L CC]
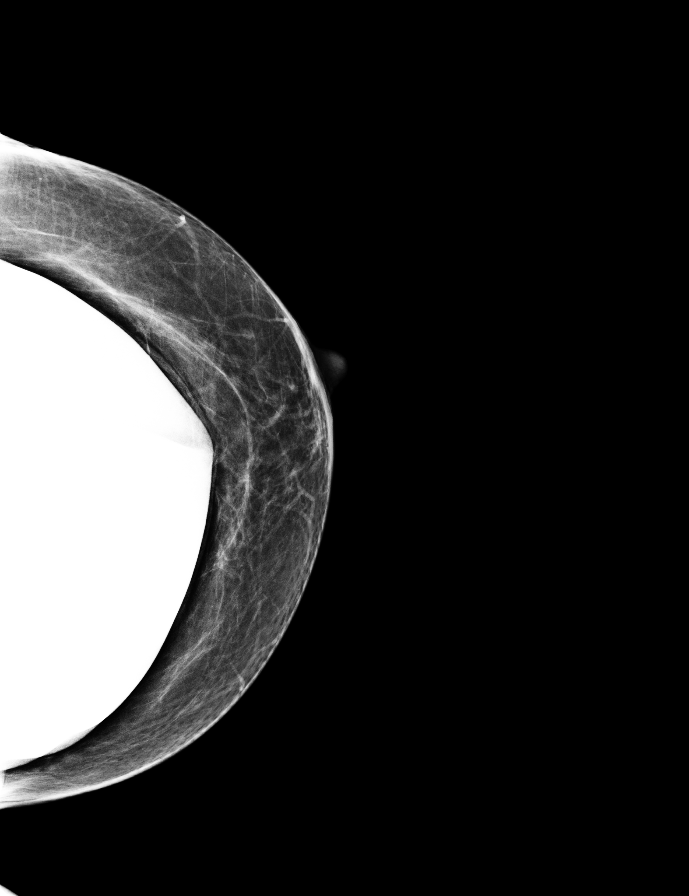

[R MLO synth-2D]
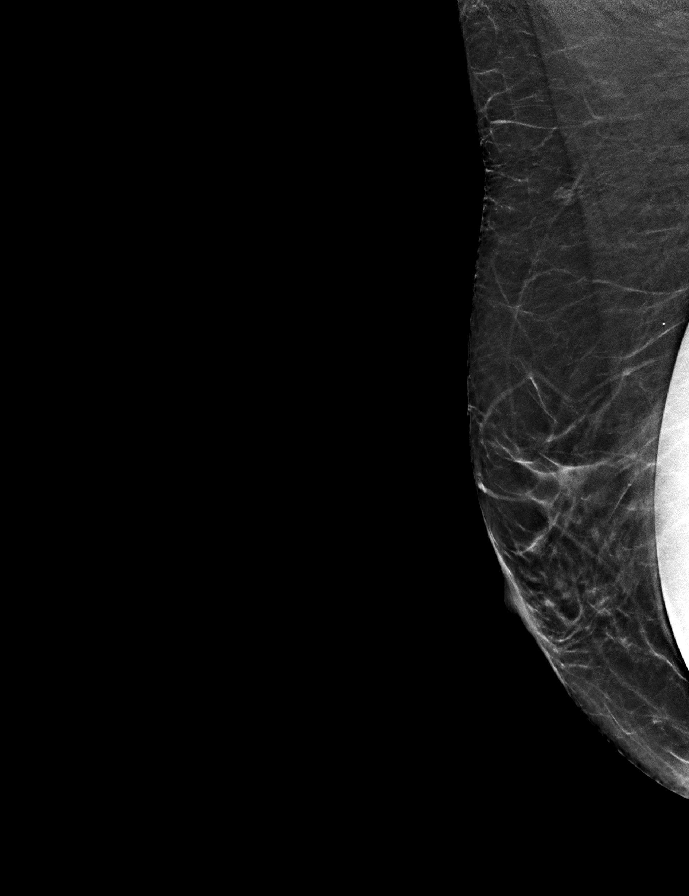

[R CC synth-2D]
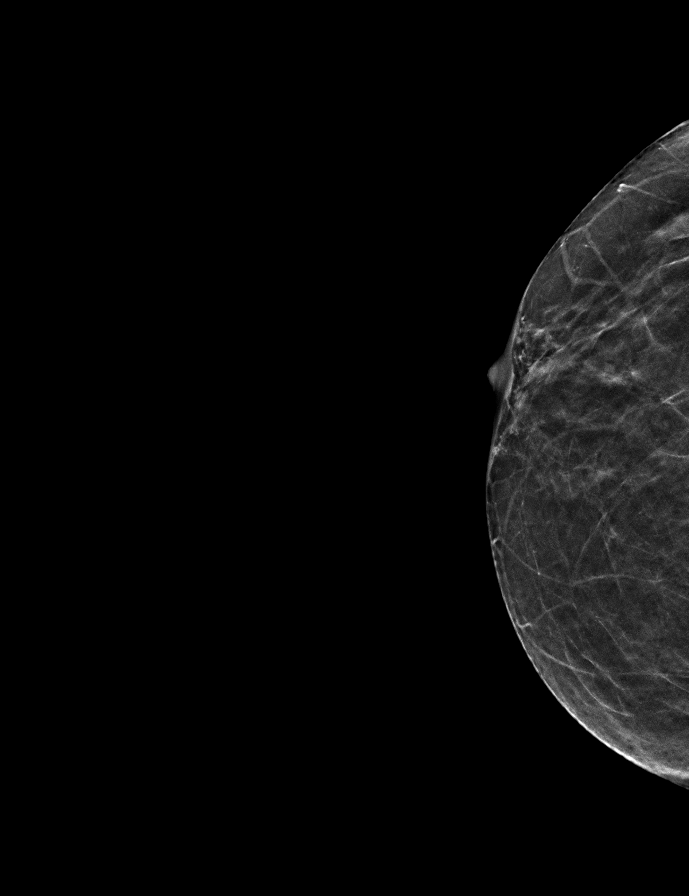

[L CC synth-2D]
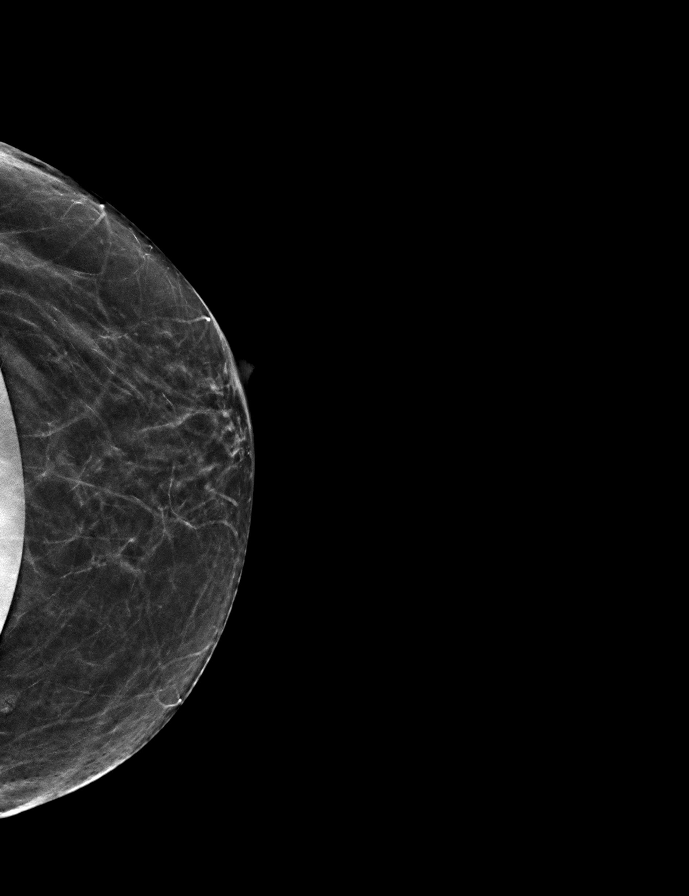

[L MLO synth-2D]
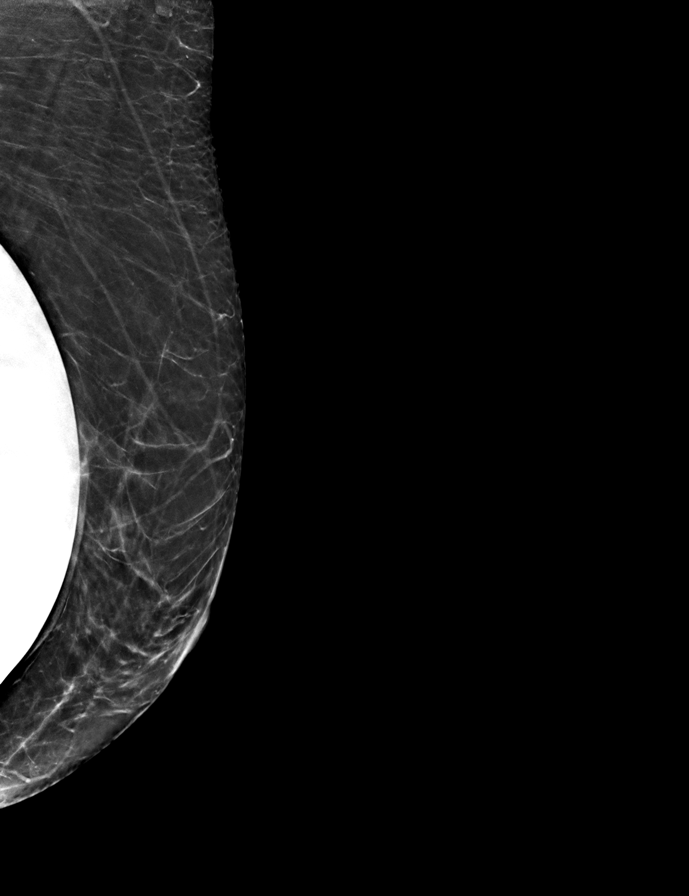

[R CCID BREAST TOMOSYNTHESIS IMAGE tomo · tomo slice 19/36.0]
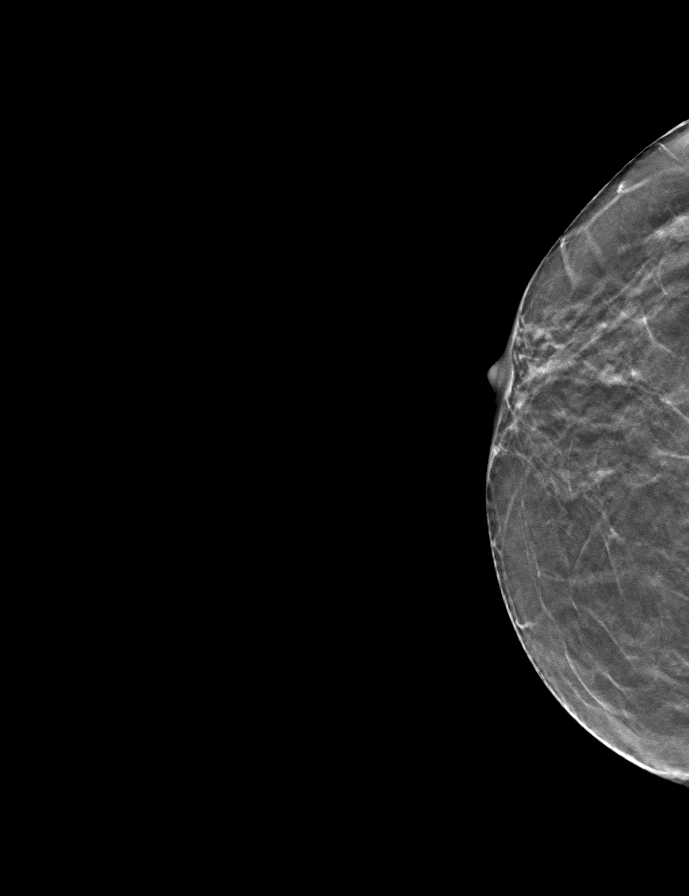

[9 of 28 positions shown; findings below may reference images not displayed]

ACR Breast Density Category b: There are scattered areas of
fibroglandular density.
FINDINGS: The patient has retropectoral implants. There are no findings
suspicious for malignancy.
IMPRESSION: No mammographic evidence of malignancy. A result letter of this
screening mammogram will be mailed directly to the patient.

RECOMMENDATION:
Screening mammogram in one year. (Code:SE-S-JMG)

BI-RADS CATEGORY  1:  Negative.

## 2024-02-17 ENCOUNTER — Encounter: Payer: Self-pay | Admitting: Gastroenterology

## 2024-02-17 NOTE — Anesthesia Preprocedure Evaluation (Addendum)
 Anesthesia Evaluation  Patient identified by MRN, date of birth, ID band Patient awake    Reviewed: Allergy & Precautions, H&P , NPO status , Patient's Chart, lab work & pertinent test results  Airway Mallampati: III  TM Distance: <3 FB Neck ROM: Full    Dental no notable dental hx. (+) Caps Has two caps, none in front upper or front lower:   Pulmonary neg pulmonary ROS   Pulmonary exam normal breath sounds clear to auscultation       Cardiovascular hypertension, negative cardio ROS Normal cardiovascular exam Rhythm:Regular Rate:Normal     Neuro/Psych negative neurological ROS  negative psych ROS   GI/Hepatic negative GI ROS, Neg liver ROS,GERD  ,,  Endo/Other  negative endocrine ROS    Renal/GU Renal diseasenegative Renal ROS  negative genitourinary   Musculoskeletal negative musculoskeletal ROS (+) Arthritis ,    Abdominal   Peds negative pediatric ROS (+)  Hematology negative hematology ROS (+) Blood dyscrasia, anemia   Anesthesia Other Findings Medical History  Hypertension  Osteopenia Cancer (HCC)  Personal history of chemotherapy GERD (gastroesophageal reflux disease) Arthritis 10-11-23 platelets 161 Thrombocytopenia Hypocalcemia Chronic kidney disease Dysphagia     Reproductive/Obstetrics negative OB ROS                              Anesthesia Physical Anesthesia Plan  ASA: 3  Anesthesia Plan: General   Post-op Pain Management:    Induction: Intravenous  PONV Risk Score and Plan:   Airway Management Planned: Natural Airway and Nasal Cannula  Additional Equipment:   Intra-op Plan:   Post-operative Plan:   Informed Consent: I have reviewed the patients History and Physical, chart, labs and discussed the procedure including the risks, benefits and alternatives for the proposed anesthesia with the patient or authorized representative who has indicated his/her  understanding and acceptance.     Dental Advisory Given  Plan Discussed with: Anesthesiologist, CRNA and Surgeon  Anesthesia Plan Comments: (Patient consented for risks of anesthesia including but not limited to:  - adverse reactions to medications - risk of airway placement if required - damage to eyes, teeth, lips or other oral mucosa - nerve damage due to positioning  - sore throat or hoarseness - Damage to heart, brain, nerves, lungs, other parts of body or loss of life  Patient voiced understanding and assent.)         Anesthesia Quick Evaluation

## 2024-02-20 ENCOUNTER — Encounter
Admission: RE | Disposition: A | Discharge status: Home / Self Care | Payer: Self-pay | Source: Home / Self Care | Attending: Gastroenterology

## 2024-02-20 ENCOUNTER — Other Ambulatory Visit: Payer: Self-pay

## 2024-02-20 ENCOUNTER — Encounter: Payer: Self-pay | Admitting: Gastroenterology

## 2024-02-20 ENCOUNTER — Ambulatory Visit
Admission: RE | Admit: 2024-02-20 | Discharge: 2024-02-20 | Disposition: A | Discharge status: Home / Self Care | Attending: Gastroenterology | Admitting: Gastroenterology

## 2024-02-20 ENCOUNTER — Ambulatory Visit: Payer: Self-pay | Admitting: Anesthesiology

## 2024-02-20 DIAGNOSIS — D123 Benign neoplasm of transverse colon: Secondary | ICD-10-CM | POA: Insufficient documentation

## 2024-02-20 DIAGNOSIS — D122 Benign neoplasm of ascending colon: Secondary | ICD-10-CM | POA: Insufficient documentation

## 2024-02-20 DIAGNOSIS — D120 Benign neoplasm of cecum: Secondary | ICD-10-CM | POA: Insufficient documentation

## 2024-02-20 DIAGNOSIS — K219 Gastro-esophageal reflux disease without esophagitis: Secondary | ICD-10-CM | POA: Insufficient documentation

## 2024-02-20 DIAGNOSIS — N189 Chronic kidney disease, unspecified: Secondary | ICD-10-CM | POA: Diagnosis not present

## 2024-02-20 DIAGNOSIS — I129 Hypertensive chronic kidney disease with stage 1 through stage 4 chronic kidney disease, or unspecified chronic kidney disease: Secondary | ICD-10-CM | POA: Insufficient documentation

## 2024-02-20 DIAGNOSIS — R195 Other fecal abnormalities: Secondary | ICD-10-CM | POA: Insufficient documentation

## 2024-02-20 HISTORY — DX: Dysphagia, unspecified: R13.10

## 2024-02-20 HISTORY — PX: POLYPECTOMY: SHX149

## 2024-02-20 HISTORY — DX: Gastro-esophageal reflux disease without esophagitis: K21.9

## 2024-02-20 HISTORY — DX: Unspecified osteoarthritis, unspecified site: M19.90

## 2024-02-20 HISTORY — DX: Acute myeloblastic leukemia, in remission: C92.01

## 2024-02-20 HISTORY — DX: Thrombocytopenia, unspecified: D69.6

## 2024-02-20 HISTORY — PX: COLONOSCOPY: SHX5424

## 2024-02-20 HISTORY — DX: Chronic kidney disease, unspecified: N18.9

## 2024-02-20 HISTORY — DX: Hypocalcemia: E83.51

## 2024-02-20 SURGERY — COLONOSCOPY
Anesthesia: General | Site: Rectum

## 2024-02-20 MED ORDER — PROPOFOL 1000 MG/100ML IV EMUL
INTRAVENOUS | Status: AC
  Filled 2024-02-20: qty 100

## 2024-02-20 MED ORDER — LIDOCAINE HCL (CARDIAC) PF 100 MG/5ML IV SOSY
PREFILLED_SYRINGE | INTRAVENOUS | Status: DC | PRN
  Administered 2024-02-20: 50 mg via INTRAVENOUS

## 2024-02-20 MED ORDER — PROPOFOL 10 MG/ML IV BOLUS
INTRAVENOUS | Status: AC
  Filled 2024-02-20: qty 20

## 2024-02-20 MED ORDER — SODIUM CHLORIDE 0.9 % IV SOLN: INTRAVENOUS | Status: DC

## 2024-02-20 MED ORDER — PROPOFOL 10 MG/ML IV BOLUS
INTRAVENOUS | Status: DC | PRN
  Administered 2024-02-20: 30 mg via INTRAVENOUS
  Administered 2024-02-20: 70 mg via INTRAVENOUS
  Administered 2024-02-20 (×9): 30 mg via INTRAVENOUS

## 2024-02-20 MED ORDER — LACTATED RINGERS IV SOLN
INTRAVENOUS | Status: DC
Start: 2024-02-20 — End: 2024-02-20

## 2024-02-20 MED ORDER — STERILE WATER FOR IRRIGATION IR SOLN
Status: DC | PRN
  Administered 2024-02-20: 500 mL

## 2024-02-20 SURGICAL SUPPLY — 18 items
CLIP HMST 235XBRD CATH ROT (MISCELLANEOUS) IMPLANT
ELECTRODE REM PT RTRN 9FT ADLT (ELECTROSURGICAL) IMPLANT
FORCEPS BIOP RAD 4 LRG CAP 4 (CUTTING FORCEPS) IMPLANT
FORCEPS ESCP3.2XJMB 240X2.8X (MISCELLANEOUS) IMPLANT
GAUZE SPONGE 4X4 12PLY STRL (GAUZE/BANDAGES/DRESSINGS) IMPLANT
GOWN CVR UNV OPN BCK APRN NK (MISCELLANEOUS) ×4 IMPLANT
INJECTABLE ELEVIEW COMP 10 (MISCELLANEOUS) IMPLANT
INJECTOR VARIJECT VIN23 (MISCELLANEOUS) IMPLANT
KIT DEFENDO VALVE AND CONN (KITS) IMPLANT
KIT PROCEDURE OLYMPUS (MISCELLANEOUS) ×2 IMPLANT
MANIFOLD NEPTUNE II (INSTRUMENTS) ×2 IMPLANT
MARKER SPOT ENDO TATTOO 5ML (MISCELLANEOUS) IMPLANT
PROBE APC STR FIRE (PROBE) IMPLANT
RETRIEVER NET ROTH 2.5X230 LF (MISCELLANEOUS) IMPLANT
SNARE COLD EXACTO (MISCELLANEOUS) IMPLANT
SNARE LASSO HEX 3 IN 1 (INSTRUMENTS) IMPLANT
TRAP ETRAP POLY (MISCELLANEOUS) IMPLANT
WATER STERILE IRR 250ML POUR (IV SOLUTION) ×2 IMPLANT

## 2024-02-20 NOTE — Transfer of Care (Signed)
 Immediate Anesthesia Transfer of Care Note  Patient: Jenna Wiley  Procedure(s) Performed: COLONOSCOPY (Rectum) POLYPECTOMY, INTESTINE (Rectum)  Patient Location: PACU  Anesthesia Type: General  Level of Consciousness: awake, alert  and patient cooperative  Airway and Oxygen Therapy: Patient Spontanous Breathing and Patient connected to supplemental oxygen  Post-op Assessment: Post-op Vital signs reviewed, Patient's Cardiovascular Status Stable, Respiratory Function Stable, Patent Airway and No signs of Nausea or vomiting  Post-op Vital Signs: Reviewed and stable  Complications: No notable events documented.

## 2024-02-20 NOTE — H&P (Signed)
 Jenna JONELLE Brooklyn, MD [facility], DHIP [address]  [place], [place] [address]  Main: [phone] Fax:  [phone] Pager: [phone]   Primary Care Physician:  Lenon Layman ORN, MD Primary Gastroenterologist:  Dr. Corinn JONELLE Wiley  Pre-Procedure History & Physical: HPI:  Jenna Wiley is a 73 y.o. female is here for an colonoscopy.   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)    Arthritis    Cancer (HCC) 07/09/2018   AML leukemia   Chronic kidney disease    Dysphagia    GERD (gastroesophageal reflux disease)    Hypertension    Hypocalcemia    Osteopenia    Personal history of chemotherapy    Thrombocytopenia     Past Surgical History:  Procedure Laterality Date   AUGMENTATION MAMMAPLASTY Bilateral    2006; replaced 2017   BREAST IMPLANT REMOVAL  2017   CESAREAN SECTION  1983   PERCUTANEOUS FIXATION TIBIAL SHAFT FRACTURE W/ PINS / SCREWS  2018   pin in thumb   PLACEMENT OF BREAST IMPLANTS  2006   SHOULDER ARTHROSCOPY WITH BANKART REPAIR Right 11/14/2020   Procedure: Right shoulder arthroscopic  Bankart repair- Krystal Doyne to Assist;  Surgeon: Tobie Priest, MD;  Location: [facility];  Service: Orthopedics;  Laterality: Right;    Prior to Admission medications   Medication Sig Start Date End Date Taking? Authorizing Provider  amLODipine (NORVASC) 5 MG tablet Take 1 tablet by mouth daily. 01/24/20  Yes [provider]  atorvastatin (LIPITOR) 10 MG tablet Take 10 mg by mouth daily.   Yes [provider]  calcium citrate-vitamin D (CITRACAL+D) 315-200 MG-UNIT tablet Take by mouth.   Yes [provider]  Cholecalciferol 50 MCG (2000 UT) TABS Take by mouth.   Yes [provider]  COLLAGEN PO Take by mouth.   Yes [provider]  diphenhydrAMINE (BENADRYL) 25 mg capsule Take 25 mg by mouth every 6 (six) hours as needed.   Yes [provider]  ELDERBERRY PO Take by mouth.   Yes [provider]  MAGNESIUM PO Take 1 tablet by mouth daily.   Yes [provider]  Melatonin 10 MG TABS Take by mouth.   Yes [provider]  Multiple Vitamin (MULTIVITAMIN ADULT PO) Take by mouth.   Yes [provider]  Omega-3 Fatty Acids (FISH OIL PO) Take 1 capsule by mouth daily.   Yes [provider]  omeprazole (PRILOSEC) 20 MG capsule Take 20 mg by mouth daily.   Yes [provider]  Probiotic Product (PROBIOTIC DAILY PO) Take by mouth daily.   Yes [provider]  TURMERIC PO Take by mouth.   Yes [provider]  vitamin B-12 (CYANOCOBALAMIN) 1000 MCG tablet Take 1,000 mcg by mouth daily.   Yes [provider]    Allergies as of 02/07/2024 - Review Complete 10/11/2023  Allergen Reaction Noted   Nitrofurantoin Hives 11/02/2018    Family History  Problem Relation Age of Onset   Brain cancer Mother    Liver cancer Father    Hypertension Father    Breast cancer Neg Hx     Social History   Socioeconomic History   Marital status: Married    Spouse name: Not on file   Number of children: Not on file   Years of education: Not on file   Highest education level: Not on file  Occupational History  Not on file  Tobacco Use   Smoking status: Never   Smokeless tobacco: Never  Vaping Use   Vaping status: Never Used  Substance and Sexual Activity   Alcohol use: Yes    Alcohol/week: 2.0 standard drinks of alcohol    Types: 2 Cans of beer per week    Comment: social drinker   Drug use: Not Currently   Sexual activity: Yes  Other Topics Concern   Not on file  Social History Narrative   Not on file   Social Drivers of Health   Financial Resource Strain: Low Risk  (03/23/2023)   Received from [facility]   Overall Financial Resource Strain (CARDIA)    Difficulty of Paying Living Expenses: Not hard at all  Food Insecurity:  No Food Insecurity (03/23/2023)   Received from [facility]   Hunger Vital Sign    Within the past 12 months, you worried that your food would run out before you got the money to buy more.: Never true    Within the past 12 months, the food you bought just didn't last and you didn't have money to get more.: Never true  Transportation Needs: No Transportation Needs (03/23/2023)   Received from [facility]    In the past 12 months, has lack of transportation kept you from medical appointments or from getting medications?: No    Lack of Transportation (Non-Medical): No  Physical Activity: Not on file  Stress: Not on file  Social Connections: Not on file  Intimate Partner Violence: Not on file    Review of Systems: See HPI, otherwise negative ROS  Physical Exam: BP 130/85   Temp 98.2 F (36.8 C) (Temporal)   Resp 16   Ht 5' 4.02 (1.626 m)   Wt 73 kg   SpO2 98%   BMI 27.62 kg/m  General:   Alert,  pleasant and cooperative in NAD Head:  Normocephalic and atraumatic. Neck:  Supple; no masses or thyromegaly. Lungs:  Clear throughout to auscultation.    Heart:  Regular rate and rhythm. Abdomen:  Soft, nontender and nondistended. Normal bowel sounds, without guarding, and without rebound.   Neurologic:  Alert and  oriented x4;  grossly normal neurologically.  Impression/Plan: Jenna Wiley is here for an colonoscopy to be performed for cologuard positive  Risks, benefits, limitations, and alternatives regarding  colonoscopy have been reviewed with the patient.  Questions have been answered.  All parties agreeable.   Jenna Brooklyn, MD  02/20/2024, 7:46 AM

## 2024-02-20 NOTE — Op Note (Signed)
 [facility] Patient Name: Jenna Wiley Procedure Date: 02/20/2024 7:21 AM MRN: [id number] Account #: [id number] Date of Birth: 04/09/1951 Admit Type: Outpatient Age: 73 Room: [facility] OR ROOM 01 Gender: Female Note Status: Finalized Instrument Name: Colonoscope [id number] Procedure:             Colonoscopy Indications:           Positive Cologuard test Providers:             Corinn Jess Brooklyn MD, MD Referring MD:          Layman ORN. Lenon MD, MD (Referring MD) Medicines:             General Anesthesia Complications:         No immediate complications. Estimated blood loss: None. Procedure:             Pre-Anesthesia Assessment:                        - Prior to the procedure, a History and Physical was                         performed, and patient medications and allergies were                         reviewed. The patient is competent. The risks and                         benefits of the procedure and the sedation options and                         risks were discussed with the patient. All questions                         were answered and informed consent was obtained.                         Patient identification and proposed procedure were                         verified by the physician, the nurse, the                         anesthesiologist, the anesthetist and the technician                         in the pre-procedure area in the procedure room in the                         endoscopy suite. Mental Status Examination: alert and                         oriented. Airway Examination: normal oropharyngeal                         airway and neck mobility. Respiratory Examination:                         clear to auscultation. CV Examination: normal.  Prophylactic Antibiotics: The patient does not require                         prophylactic antibiotics. Prior Anticoagulants: The                          patient has taken no anticoagulant or antiplatelet                         agents. ASA Grade Assessment: III - A patient with                         severe systemic disease. After reviewing the risks and                         benefits, the patient was deemed in satisfactory                         condition to undergo the procedure. The anesthesia                         plan was to use general anesthesia. Immediately prior                         to administration of medications, the patient was                         re-assessed for adequacy to receive sedatives. The                         heart rate, respiratory rate, oxygen saturations,                         blood pressure, adequacy of pulmonary ventilation, and                         response to care were monitored throughout the                         procedure. The physical status of the patient was                         re-assessed after the procedure.                        After obtaining informed consent, the colonoscope was                         passed under direct vision. Throughout the procedure,                         the patient's blood pressure, pulse, and oxygen                         saturations were monitored continuously. The                         Colonoscope was introduced through the anus and  advanced to the the terminal ileum, with                         identification of the appendiceal orifice and IC                         valve. The colonoscopy was performed without                         difficulty. The patient tolerated the procedure well.                         The quality of the bowel preparation was evaluated                         using the BBPS [facility] Bowel Preparation Scale) with                         scores of: Right Colon = 3, Transverse Colon = 3 and                         Left Colon = 3 (entire mucosa seen well with no                         residual  staining, small fragments of stool or opaque                         liquid). The total BBPS score equals 9. The terminal                         ileum, ileocecal valve, appendiceal orifice, and                         rectum were photographed. Findings:      The perianal and digital rectal examinations were normal. Pertinent       negatives include normal sphincter tone and no palpable rectal lesions.      A 10 mm polyp was found in the cecum. The polyp was flat. Preparations       were made for mucosal resection. Demarcation of the lesion was performed       with narrow band imaging to clearly identify the boundaries of the       lesion. Eleview was injected to raise the lesion. Snare mucosal       resection was performed. Resection and retrieval were complete. Resected       tissue including tissue margins will be examined by histology. To       prevent bleeding after mucosal resection, one hemostatic clip was       successfully placed (MR safe). Clip manufacturer: [employer].       There was no bleeding during, or at the end, of the procedure.      An 11 mm polyp was found in the ascending colon. The polyp was flat.       Preparations were made for mucosal resection. Demarcation of the lesion       was performed with narrow band imaging to clearly identify the       boundaries of the lesion. Eleview was injected to raise the  lesion.       Snare mucosal resection was performed. Resection and retrieval were       complete. Resected tissue including tissue margins will be examined by       histology. To prevent bleeding after mucosal resection, one hemostatic       clip was successfully placed (MR safe). Clip manufacturer: [employer]. There was no bleeding during, or at the end, of the       procedure. Estimated blood loss: none.      Two sessile polyps were found in the ascending colon and cecum. The       polyps were 3 to 4 mm in size. These polyps were removed with  a cold       snare. Resection and retrieval were complete. Estimated blood loss: none.      A 4 mm polyp was found in the transverse colon. The polyp was sessile.       The polyp was removed with a cold snare. Resection was complete, but the       polyp tissue was not retrieved. Estimated blood loss: none.      The retroflexed view of the distal rectum and anal verge was normal and       showed no anal or rectal abnormalities. Impression:            - One 10 mm polyp in the cecum, removed with mucosal                         resection. Resected and retrieved. Clip manufacturer:                         [employer]. Clip (MR safe) was placed.                        - One 11 mm polyp in the ascending colon, removed with                         mucosal resection. Resected and retrieved. Clip                         manufacturer: [employer]. Clip (MR safe) was                         placed.                        - Two 3 to 4 mm polyps in the ascending colon and in                         the cecum, removed with a cold snare. Resected and                         retrieved.                        - One 4 mm polyp in the transverse colon, removed with                         a cold snare. Complete resection. Polyp tissue not  retrieved.                        - The distal rectum and anal verge are normal on                         retroflexion view.                        - Mucosal resection was performed. Resection and                         retrieval were complete.                        - Mucosal resection was performed. Resection and                         retrieval were complete. Recommendation:        - Discharge patient to home (with escort).                        - Resume previous diet today.                        - Continue present medications.                        - Await pathology results.                        - Repeat colonoscopy in 3  years for surveillance of                         multiple polyps. Procedure Code(s):     --- Professional ---                        [phone], Colonoscopy, flexible; with endoscopic mucosal                         resection                        [id number], 59, Colonoscopy, flexible; with removal of                         tumor(s), polyp(s), or other lesion(s) by snare                         technique Diagnosis Code(s):     --- Professional ---                        D12.0, Benign neoplasm of cecum                        D12.2, Benign neoplasm of ascending colon                        D12.3, Benign neoplasm of transverse colon (hepatic                         flexure  or splenic flexure)                        R19.5, Other fecal abnormalities CPT copyright 2022 American Medical Association. All rights reserved. The codes documented in this report are preliminary and upon coder review may  be revised to meet current compliance requirements. Dr. Corinn Brooklyn Corinn Jess Brooklyn MD, MD 02/20/2024 8:35:04 AM This report has been signed electronically. Number of Addenda: 0 Note Initiated On: 02/20/2024 7:21 AM Scope Withdrawal Time: 0 hours 21 minutes 8 seconds  Total Procedure Duration: 0 hours 27 minutes 35 seconds  Estimated Blood Loss:  Estimated blood loss: none.      [facility]

## 2024-02-20 NOTE — Anesthesia Postprocedure Evaluation (Signed)
 Anesthesia Post Note  Patient: Jenna Wiley  Procedure(s) Performed: COLONOSCOPY (Rectum) POLYPECTOMY, INTESTINE (Rectum)  Patient location during evaluation: PACU Anesthesia Type: General Level of consciousness: awake and alert Pain management: pain level controlled Vital Signs Assessment: post-procedure vital signs reviewed and stable Respiratory status: spontaneous breathing, nonlabored ventilation, respiratory function stable and patient connected to nasal cannula oxygen Cardiovascular status: blood pressure returned to baseline and stable Postop Assessment: no apparent nausea or vomiting Anesthetic complications: no   No notable events documented.   Last Vitals:  Vitals:   02/20/24 0835 02/20/24 0841  BP: 107/76 124/74  Pulse: 72 70  Resp: 18 20  Temp: 36.7 C 36.6 C  SpO2: 98% 98%    Last Pain:  Vitals:   02/20/24 0841  TempSrc:   PainSc: 0-No pain                 Donny BROCKS Teniqua Marron

## 2024-02-22 ENCOUNTER — Ambulatory Visit: Payer: Self-pay | Admitting: Gastroenterology

## 2024-02-22 LAB — SURGICAL PATHOLOGY

## 2024-06-07 ENCOUNTER — Other Ambulatory Visit: Payer: Self-pay | Admitting: Internal Medicine

## 2024-06-07 DIAGNOSIS — Z1231 Encounter for screening mammogram for malignant neoplasm of breast: Secondary | ICD-10-CM

## 2024-07-23 ENCOUNTER — Ambulatory Visit
